# Patient Record
Sex: Female | Born: 1945 | Race: White | Hispanic: No | Marital: Married | Smoking: Never smoker
Health system: Southern US, Community
[De-identification: ages and names within clinical notes are randomized; demographics above are authoritative.]

## PROBLEM LIST (undated history)

## (undated) DIAGNOSIS — D649 Anemia, unspecified: Secondary | ICD-10-CM

## (undated) DIAGNOSIS — K449 Diaphragmatic hernia without obstruction or gangrene: Secondary | ICD-10-CM

## (undated) DIAGNOSIS — F329 Major depressive disorder, single episode, unspecified: Secondary | ICD-10-CM

## (undated) DIAGNOSIS — S4291XA Fracture of right shoulder girdle, part unspecified, initial encounter for closed fracture: Secondary | ICD-10-CM

## (undated) DIAGNOSIS — R011 Cardiac murmur, unspecified: Secondary | ICD-10-CM

## (undated) DIAGNOSIS — M7580 Other shoulder lesions, unspecified shoulder: Secondary | ICD-10-CM

## (undated) DIAGNOSIS — K259 Gastric ulcer, unspecified as acute or chronic, without hemorrhage or perforation: Secondary | ICD-10-CM

## (undated) DIAGNOSIS — S42401A Unspecified fracture of lower end of right humerus, initial encounter for closed fracture: Secondary | ICD-10-CM

## (undated) DIAGNOSIS — K219 Gastro-esophageal reflux disease without esophagitis: Secondary | ICD-10-CM

## (undated) DIAGNOSIS — S62101A Fracture of unspecified carpal bone, right wrist, initial encounter for closed fracture: Secondary | ICD-10-CM

## (undated) DIAGNOSIS — K222 Esophageal obstruction: Secondary | ICD-10-CM

## (undated) HISTORY — DX: Esophageal obstruction: K22.2

## (undated) HISTORY — PX: COLONOSCOPY: SHX174

## (undated) HISTORY — DX: Unspecified fracture of lower end of right humerus, initial encounter for closed fracture: S42.401A

## (undated) HISTORY — DX: Anemia, unspecified: D64.9

## (undated) HISTORY — DX: Gastric ulcer, unspecified as acute or chronic, without hemorrhage or perforation: K25.9

## (undated) HISTORY — DX: Major depressive disorder, single episode, unspecified: F32.9

## (undated) HISTORY — DX: Diaphragmatic hernia without obstruction or gangrene: K44.9

## (undated) HISTORY — PX: ABDOMINAL HYSTERECTOMY: SHX81

## (undated) HISTORY — DX: Fracture of right shoulder girdle, part unspecified, initial encounter for closed fracture: S42.91XA

## (undated) HISTORY — DX: Cardiac murmur, unspecified: R01.1

## (undated) HISTORY — PX: UPPER GASTROINTESTINAL ENDOSCOPY: SHX188

## (undated) HISTORY — DX: Gastro-esophageal reflux disease without esophagitis: K21.9

## (undated) HISTORY — DX: Fracture of unspecified carpal bone, right wrist, initial encounter for closed fracture: S62.101A

## (undated) HISTORY — DX: Other shoulder lesions, unspecified shoulder: M75.80

---

## 1973-10-10 HISTORY — PX: TUBAL LIGATION: SHX77

## 2001-10-10 HISTORY — PX: GASTRIC BYPASS: SHX52

## 2007-10-11 HISTORY — PX: CHOLECYSTECTOMY: SHX55

## 2010-10-20 ENCOUNTER — Ambulatory Visit
Admission: RE | Admit: 2010-10-20 | Discharge: 2010-10-20 | Payer: Self-pay | Source: Home / Self Care | Attending: Family Medicine | Admitting: Family Medicine

## 2010-10-20 ENCOUNTER — Encounter: Payer: Self-pay | Admitting: Family Medicine

## 2010-10-20 DIAGNOSIS — Z9884 Bariatric surgery status: Secondary | ICD-10-CM | POA: Insufficient documentation

## 2010-10-20 DIAGNOSIS — R1110 Vomiting, unspecified: Secondary | ICD-10-CM | POA: Insufficient documentation

## 2010-10-21 ENCOUNTER — Encounter: Payer: Self-pay | Admitting: Family Medicine

## 2010-10-22 ENCOUNTER — Encounter: Payer: Self-pay | Admitting: Family Medicine

## 2010-10-22 LAB — CONVERTED CEMR LAB
ALT: 11 units/L (ref 0–35)
Albumin: 4.4 g/dL (ref 3.5–5.2)
BUN: 11 mg/dL (ref 6–23)
CO2: 24 meq/L (ref 19–32)
Calcium: 9.3 mg/dL (ref 8.4–10.5)
Chloride: 105 meq/L (ref 96–112)
Cholesterol: 174 mg/dL (ref 0–200)
Creatinine, Ser: 0.54 mg/dL (ref 0.40–1.20)
HCT: 38.2 % (ref 36.0–46.0)
HDL: 88 mg/dL (ref >39)
Hemoglobin: 11.8 g/dL — ABNORMAL LOW (ref 12.0–15.0)
Lipase: <10 units/L (ref 0–75)
RBC: 4.02 M/uL (ref 3.87–5.11)
Total CHOL/HDL Ratio: 2
Vit D, 25-Hydroxy: 27 ng/mL — ABNORMAL LOW (ref 30–89)
Vitamin B-12: 197 pg/mL — ABNORMAL LOW (ref 211–911)
WBC: 5.4 10*3/uL (ref 4.0–10.5)

## 2010-10-25 ENCOUNTER — Encounter: Payer: Self-pay | Admitting: Family Medicine

## 2010-10-25 LAB — CONVERTED CEMR LAB: Iron: 144 ug/dL (ref 42–145)

## 2010-10-26 ENCOUNTER — Ambulatory Visit
Admission: RE | Admit: 2010-10-26 | Discharge: 2010-10-26 | Payer: Self-pay | Source: Home / Self Care | Attending: Family Medicine | Admitting: Family Medicine

## 2010-10-26 ENCOUNTER — Encounter
Admission: RE | Admit: 2010-10-26 | Discharge: 2010-10-26 | Payer: Self-pay | Source: Home / Self Care | Attending: Family Medicine | Admitting: Family Medicine

## 2010-10-26 DIAGNOSIS — D510 Vitamin B12 deficiency anemia due to intrinsic factor deficiency: Secondary | ICD-10-CM | POA: Insufficient documentation

## 2010-10-27 ENCOUNTER — Encounter: Payer: Self-pay | Admitting: Family Medicine

## 2010-11-04 ENCOUNTER — Ambulatory Visit
Admission: RE | Admit: 2010-11-04 | Discharge: 2010-11-04 | Payer: Self-pay | Source: Home / Self Care | Attending: Family Medicine | Admitting: Family Medicine

## 2010-11-11 ENCOUNTER — Encounter: Payer: Self-pay | Admitting: Family Medicine

## 2010-11-11 ENCOUNTER — Ambulatory Visit: Payer: Self-pay

## 2010-11-11 DIAGNOSIS — D510 Vitamin B12 deficiency anemia due to intrinsic factor deficiency: Secondary | ICD-10-CM

## 2010-11-11 NOTE — Assessment & Plan Note (Signed)
Summary: B12-acm  Nurse Visit   Past History:  Past Medical History: Esophageal ring and hiatal hernia seen on EGD 10-27-2010 at [facility].    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

## 2010-11-11 NOTE — Assessment & Plan Note (Signed)
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

## 2010-11-11 NOTE — Assessment & Plan Note (Addendum)
Summary: NOV: Vomiting, bariatric status   Vital Signs:  Patient profile:   65 year old female Height:      61 inches Weight:      125 pounds Pulse rate:   86 / minute BP sitting:   138 / 84  (right arm) Cuff size:   regular  Vitals Entered By: Avon Gully CMA, (AAMA) (October 20, 2010 8:16 AM) CC: [facility]   CC:  NP-[facility].  History of Present Illness: Vomiting after eats for the last couple of years.  Has problems with stomach pain, diarrhea and has had tht on and off for year. Had labwork a year ago. Has been seeing Dr. Lissa Morales.  Famly hx of esophageal cancer. She is not on her vitamins post bypass and hasn't for years.  Had her flu shot in Oct.  No change in bowels recently. Dx have a dx of IBS.     3 arm fractures in the last 7 months. Had a bone density about a year ago and was told it was normal.   Eyes have been watering, fuzzing adn uncomfortable for over a month. No change in vision.  Says she realy needs an eye exam.    Habits & Providers  Alcohol-Tobacco-Diet     Alcohol drinks/day: 0     Tobacco Status: never  Exercise-Depression-Behavior     Does Patient Exercise: no     STD Risk: never     Drug Use: no  Past History:  Past Surgical History: Cholecystectomy 209 Gastric bypass 2003 Tubal ligation 1975 Hysterectomy ?  1980s for fibroid  Family History: Father with lung and esophageal Ca Mother with MI/stroke at age 8   Social History: Retired Air traffic controller.  Businesss degree, 1 year.  Married to Girard with 2 daughters and 1 son.  Lives with her husbadn.   2-3 caffeinated drinks per day/  Never Smoked Alcohol use-no Drug use-no Regular exercise-no Smoking Status:  never STD Risk:  never Drug Use:  no Does Patient Exercise:  no  Review of Systems       No fever/sweats/weakness, unexplained weight loss/gain.  + vison changes.  No difficulty hearing/ringing in ears, + hay fever/allergies.  No chest pain/discomfort, palpitations.  No Br  lump/nipple discharge.  No cough/wheeze.  No blood in BM, + nausea/vomiting/diarrhea.  + nighttime urination. No leaking urine, unusual vaginal bleeding, discharge (penis or vagina).  No muscle/joint pain. No rash, change in mole.  + HA, memory loss.  No anxiety, + sleep d/o. No depression.  No easy bruising/bleeding, unexplained lump   Physical Exam  General:  Well-developed,well-nourished,in no acute distress; alert,appropriate and cooperative throughout examination Head:  Normocephalic and atraumatic without obvious abnormalities. No apparent alopecia or balding. Eyes:  No corneal or conjunctival inflammation noted. EOMI. Perrla.  Neck:  Mildly enlarged salivary glands. No TM.  Lungs:  Normal respiratory effort, chest expands symmetrically. Lungs are clear to auscultation, no crackles or wheezes. Heart:  Normal rate and regular rhythm. S1 and S2 normal without gallop, murmur, click, rub or other extra sounds. Abdomen:  Epigastric tenderness.soft, normal bowel sounds, no hepatomegaly, and no splenomegaly.   Skin:  no rashes.   Psych:  Cognition and judgment appear intact. Alert and cooperative with normal attention span and concentration. No apparent delusions, illusions, hallucinations   Impression & Recommendations:  Problem # 1:  VOMITING ALONE (ICD-787.03) Discussed that I am extremley concerned about this. I would like to have her see GI.  Cancer or esoph sgtricture  or complication from her surgery needs to be rule out.  Will check labs and rule out electrolyte abnormalities and anemias from her vomiting and post bypastt.  She does take prevacid daily adn this helps.  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  Will check labs and rule out electrolyte abnormalities and  anemias from her vomiting and post bypastt. I did discuss that she really needs to be on a daily vitamin supplement as bypass dec her ability to absorb vitamins' Rec centrum adult chewable and vitamin D chewa ble. She says she has known hs of low vitamin D.  Orders: T-Magnesium [phone]) T-Vitamin B12 [phone]) T-Vitamin D (25-Hydroxy) [phone])  Other Orders: T-Mammography Bilateral Screening (98921) T-Lipid Profile [phone]) Ophthalmology Referral (Ophthalmology)  Patient Instructions: 1)  Recommend you start a multivitmain. Centrum makes an adult chewable.  2)  Also Nature's Made Chewable vitamin D 400 International Units (take 3-4 a day).  3)  We will call you with the GI referral.    Orders Added: 1)  New Patient Level III [99203] 2)  Gastroenterology Referral [GI] 3)  T-Mammography Bilateral Screening [77057] 4)  T-CBC No Diff [[id number]] 5)  T-Comprehensive Metabolic Panel [[id number]] 6)  T-TSH [48185-63149] 7)  T-Lipid Profile [80061-22930] 8)  T-Lipase [83690-23215] 9)  T-Magnesium [70263-78588] 10)  T-Vitamin B12 [82607-23330] 11)  T-Vitamin D (25-Hydroxy) [50277-41287] 12)  Ophthalmology Referral [Ophthalmology]   Immunization History:  Influenza Immunization History:    Influenza:  historical (07/10/2010)   Immunization History:  Influenza Immunization History:    Influenza:  Historical (07/10/2010)   Immunization History:  Influenza Immunization History:    Influenza:  historical (07/10/2010)  Appended Document: NOV: Vomiting, bariatric status   Preventive Care Screening  Colonoscopy:    Date:  01/01/2008    Next Due:  01/2013    Results:  normal  Bone Density:    Date:  09/08/2009    Next Due:  09/2011    Results:  abnormal  Mammogram:    Date:  09/16/2009    Next Due:  11/2011    Results:  normal  Pap Smear:    Date:  10/23/2009    Next Due:  10/2011    Results:  normal  Last Tetanus Booster:    Date:   10/11/2003    Results:  Historical    Immunization History:  Tetanus/Td Immunization History:    Tetanus/Td:  historical (10/11/2003)  Pneumovax Immunization History:    Pneumovax:  historical (10/10/2005)      Past History:  Past Medical History: Esophageal ring and hiatal hernia seen on EGD 10-27-2010 at [facility].  Hx of Right  shoulder, elbow, wrist fracture.  Hx of heel spurs    Appended Document: NOV: Vomiting, bariatric status   Lipid Panel Test Date: 08/20/2009                        Value        Units        H/L   Reference  Cholesterol:          159          mg/dL              (867-672) LDL Cholesterol:      66  mg/dL              (16-109) HDL Cholesterol:      80           mg/dL              (60-45) Triglyceride:         65           mg/dL              (40-981)  Lab name:             [facility]

## 2010-11-11 NOTE — Consult Note (Signed)
Summary: [facility]  [facility]   Imported By: Lanelle Bal 11/04/2010 10:27:46  _____________________________________________________________________  External Attachment:    Type:   Image     Comment:   External Document

## 2010-11-17 NOTE — Assessment & Plan Note (Signed)
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

## 2010-11-17 NOTE — Consult Note (Signed)
Summary: [facility]  [facility]   Imported By: Lanelle Bal 11/11/2010 14:23:08  _____________________________________________________________________  External Attachment:    Type:   Image     Comment:   External Document

## 2010-11-24 ENCOUNTER — Telehealth: Payer: Self-pay | Admitting: Family Medicine

## 2010-11-24 DIAGNOSIS — E559 Vitamin D deficiency, unspecified: Secondary | ICD-10-CM | POA: Insufficient documentation

## 2010-11-24 DIAGNOSIS — M949 Disorder of cartilage, unspecified: Secondary | ICD-10-CM

## 2010-11-24 DIAGNOSIS — M899 Disorder of bone, unspecified: Secondary | ICD-10-CM | POA: Insufficient documentation

## 2010-11-25 ENCOUNTER — Encounter: Payer: Self-pay | Admitting: Family Medicine

## 2010-11-25 NOTE — Procedures (Signed)
Summary: Upper Endoscopy/[facility]  Upper Endoscopy/[facility]   Imported By: Maryln Gottron 11/17/2010 11:31:52  _____________________________________________________________________  External Attachment:    Type:   Image     Comment:   External Document

## 2010-12-01 NOTE — Progress Notes (Signed)
Summary: dx code  Phone Note Other Incoming Call back at [phone] ext [phone]   Caller: Sande Rives from [facility] labs Summary of Call: needs a dx code for iron level that was done on 10/21/10 Initial call taken by: Avon Gully CMA, Duncan Dull),  November 24, 2010 1:36 PM  Follow-up for Phone Call        285.9 Follow-up by: Nani Gasser MD,  November 25, 2010 9:52 AM  Additional Follow-up for Phone Call Additional follow up Details #1::        left a message on Yvonnes vm Additional Follow-up by: Avon Gully CMA, Duncan Dull),  November 25, 2010 12:53 PM

## 2010-12-06 ENCOUNTER — Encounter: Payer: Self-pay | Admitting: Family Medicine

## 2010-12-16 NOTE — Letter (Signed)
Summary: [facility]  [facility]   Imported By: Lanelle Bal 12/10/2010 11:22:11  _____________________________________________________________________  External Attachment:    Type:   Image     Comment:   External Document

## 2010-12-16 NOTE — Letter (Signed)
Summary: Records Dated 11-08 thru 4-11/Victoria Nnadi MD  Records Dated 11-08 thru 4-11/Victoria Nnadi MD   Imported By: Lanelle Bal 12/09/2010 13:58:41  _____________________________________________________________________  External Attachment:    Type:   Image     Comment:   External Document

## 2010-12-28 NOTE — Consult Note (Signed)
Summary: [facility]-Vomiting  [facility]   Imported By: Maryln Gottron 12/21/2010 10:06:03  _____________________________________________________________________  External Attachment:    Type:   Image     Comment:   External Document

## 2011-03-23 ENCOUNTER — Encounter: Payer: Self-pay | Admitting: Family Medicine

## 2011-03-24 ENCOUNTER — Ambulatory Visit (INDEPENDENT_AMBULATORY_CARE_PROVIDER_SITE_OTHER): Payer: Medicare Other | Admitting: Family Medicine

## 2011-03-24 ENCOUNTER — Encounter: Payer: Self-pay | Admitting: Family Medicine

## 2011-03-24 VITALS — BP 112/74 | HR 71 | Temp 97.5°F | Ht 62.0 in | Wt 131.0 lb

## 2011-03-24 DIAGNOSIS — Z0000 Encounter for general adult medical examination without abnormal findings: Secondary | ICD-10-CM

## 2011-03-24 DIAGNOSIS — H9190 Unspecified hearing loss, unspecified ear: Secondary | ICD-10-CM

## 2011-03-24 MED ORDER — CEFDINIR 300 MG PO CAPS: 300.0000 mg | ORAL_CAPSULE | Freq: Two times a day (BID) | ORAL | Status: AC

## 2011-03-24 NOTE — Progress Notes (Signed)
  Subjective:    Patient ID: Carrie Shannon, female    DOB: 1945-12-19, 65 y.o.   MRN: [id number]  HPI    Review of Systems     Objective:   Physical Exam        Assessment & Plan:

## 2011-03-24 NOTE — Progress Notes (Signed)
  Subjective:    Patient ID: Carrie Shannon, female    DOB: 01-14-1946, 65 y.o.   MRN: [id number]  HPI Went to [facility] and given rx for amoxicillin for her left Ear.  Now having occ pain in her right ear as well.  No can't hear out of it. Used a Qtip this am and got some wax out. No ear drainage. No fever. Occpainful but intermittant and brief. No HA or ST now, but did initially. Says she has had to doublee the volume on her TV since this ear infection. She did complete her ABX.     Review of Systems     Objective:   Physical Exam  Constitutional: She appears well-developed and well-nourished.  HENT:  Head: Normocephalic and atraumatic.  Right Ear: External ear normal.  Left Ear: External ear normal.  Nose: Nose normal.  Mouth/Throat: Oropharynx is clear and moist.       TMS and canals are clear bilaterally. Almost no was in the left ear.   Eyes: Conjunctivae are normal. Pupils are equal, round, and reactive to light.  Neck: Neck supple. No thyromegaly present.  Lymphadenopathy:    She has no cervical adenopathy.          Assessment & Plan:  Acute hearing loss left ear-

## 2011-03-24 NOTE — Progress Notes (Signed)
Addended by: Nani Gasser D on: 03/24/2011 10:38 AM   Modules accepted: Orders, Level of Service

## 2011-03-24 NOTE — Patient Instructions (Addendum)
Call if not better in 10 days and we will refer you the ENT Can use the nasal spray 2 sprays in each nostril once a day.

## 2011-03-24 NOTE — Progress Notes (Signed)
  Subjective:    Patient ID: Carrie Shannon, female    DOB: 03-23-1946, 65 y.o.   MRN: [id number]  HPI    Review of Systems     Objective:   Physical Exam        Assessment & Plan:   Will retreat with second round of antibiotics and will add an nasal antihistamine.  Call if not better in 10 days.

## 2011-03-31 ENCOUNTER — Telehealth: Payer: Self-pay | Admitting: Family Medicine

## 2011-03-31 NOTE — Telephone Encounter (Signed)
Pt called and currently has an lear infection and is being treated from our office, and now has developed dizziness and vertigo.  Requesting prescription for these symptoms.  Plan:  Routed to Dr. Linford Arnold for requests. Jarvis Newcomer, LPN Domingo Dimes

## 2011-03-31 NOTE — Telephone Encounter (Signed)
She can use meclizine which is now over-the-counter. She no longer needs a prescription. It is actually the same drug in non-drowsy Dramamine. She can ask the pharmacist to help her find it. If she still having vertigo and dizziness after the weekend please let us know or if she feels her ear infection if not improving.

## 2011-04-01 ENCOUNTER — Telehealth: Payer: Self-pay | Admitting: Family Medicine

## 2011-04-01 NOTE — Telephone Encounter (Signed)
Pt informed by [facility] that she can get otc meclizine in non-drowsy formula.  Told to call if no better by next week Jarvis Newcomer, LPN Domingo Dimes

## 2011-04-01 NOTE — Telephone Encounter (Signed)
Pt informed that she needs to call [facility] to get her colonoscopy scheduled because they have been trying to reach her.  Gave the pt the specialist office #.  Had to leave details on machine, Carrie Shannon, Carrie Shannon

## 2011-04-01 NOTE — Telephone Encounter (Signed)
Call patient: Please have her call [facility] to schedule her [facility]. Evidently they have been trying to contact her and have been unable to get a hold of her. Thus they sent [place] a letter so that we could help expedite getting her scheduled. If she needs a referral will be happy to put one in, if not then we can just give her the phone number which is [phone] to contact [facility] and get scheduled for a colonoscopy.

## 2011-05-03 ENCOUNTER — Telehealth: Payer: Self-pay | Admitting: Family Medicine

## 2011-05-03 MED ORDER — FLUCONAZOLE 150 MG PO TABS: 150.0000 mg | ORAL_TABLET | Freq: Once | ORAL | Status: AC

## 2011-05-03 NOTE — Telephone Encounter (Signed)
Advised pt of med called in and to call if not better.

## 2011-05-03 NOTE — Telephone Encounter (Signed)
Pt calls and states she took abx 6-8 wk ago and x 2-3 has vag itching, no discharge and a rash-has been using Neosporin in her vag area! Pharm [facility]. Says it feels like a yeast inf.

## 2011-05-03 NOTE — Telephone Encounter (Signed)
Will call in diflucan but if rash not better by end of week make appt.

## 2011-10-28 ENCOUNTER — Encounter: Payer: Medicare Other | Admitting: Family Medicine

## 2011-11-02 ENCOUNTER — Telehealth: Payer: Self-pay | Admitting: Family Medicine

## 2011-11-02 ENCOUNTER — Other Ambulatory Visit: Payer: Self-pay | Admitting: Family Medicine

## 2011-11-02 DIAGNOSIS — E559 Vitamin D deficiency, unspecified: Secondary | ICD-10-CM | POA: Diagnosis not present

## 2011-11-02 DIAGNOSIS — D510 Vitamin B12 deficiency anemia due to intrinsic factor deficiency: Secondary | ICD-10-CM

## 2011-11-02 DIAGNOSIS — Z0000 Encounter for general adult medical examination without abnormal findings: Secondary | ICD-10-CM

## 2011-11-02 DIAGNOSIS — Z9884 Bariatric surgery status: Secondary | ICD-10-CM

## 2011-11-02 DIAGNOSIS — Z1231 Encounter for screening mammogram for malignant neoplasm of breast: Secondary | ICD-10-CM

## 2011-11-02 DIAGNOSIS — K3189 Other diseases of stomach and duodenum: Secondary | ICD-10-CM | POA: Diagnosis not present

## 2011-11-02 DIAGNOSIS — M899 Disorder of bone, unspecified: Secondary | ICD-10-CM

## 2011-11-02 DIAGNOSIS — Z13220 Encounter for screening for lipoid disorders: Secondary | ICD-10-CM

## 2011-11-02 NOTE — Telephone Encounter (Signed)
I printed slip. I used her dx of bariatric status, osteopenia, etc.

## 2011-11-02 NOTE — Telephone Encounter (Signed)
Patient called to reschedule cpe appt due to the snow last Friday and request to get a lab order sent downstairs by Friday Jan 25th to get blood work done a head of time before her appt.

## 2011-11-02 NOTE — Telephone Encounter (Signed)
Put in the order but it the dx of routine physical exam was rejected by medicare.what dx code should be used?

## 2011-11-04 LAB — COMPLETE METABOLIC PANEL WITH GFR
ALT: 10 U/L (ref 0–35)
AST: 17 U/L (ref 0–37)
Albumin: 4.3 g/dL (ref 3.5–5.2)
Alkaline Phosphatase: 85 U/L (ref 39–117)
BUN: 12 mg/dL (ref 6–23)
Calcium: 8.9 mg/dL (ref 8.4–10.5)
Chloride: 106 mEq/L (ref 96–112)
Potassium: 4.6 mEq/L (ref 3.5–5.3)
Sodium: 141 mEq/L (ref 135–145)
Total Protein: 6.4 g/dL (ref 6.0–8.3)

## 2011-11-04 LAB — LIPID PANEL
Cholesterol: 167 mg/dL (ref 0–200)
VLDL: 16 mg/dL (ref 0–40)

## 2011-11-05 LAB — CBC
HCT: 39.5 % (ref 36.0–46.0)
MCH: 29.7 pg (ref 26.0–34.0)
MCHC: 30.4 g/dL (ref 30.0–36.0)
MCV: 97.8 fL (ref 78.0–100.0)
Platelets: 339 10*3/uL (ref 150–400)
RDW: 13.2 % (ref 11.5–15.5)
WBC: 5.1 10*3/uL (ref 4.0–10.5)

## 2011-11-08 ENCOUNTER — Encounter: Payer: Self-pay | Admitting: Family Medicine

## 2011-11-08 ENCOUNTER — Ambulatory Visit (INDEPENDENT_AMBULATORY_CARE_PROVIDER_SITE_OTHER): Payer: Medicare Other | Admitting: Family Medicine

## 2011-11-08 VITALS — BP 111/73 | HR 74 | Temp 98.6°F | Wt 135.0 lb

## 2011-11-08 DIAGNOSIS — K3189 Other diseases of stomach and duodenum: Secondary | ICD-10-CM

## 2011-11-08 DIAGNOSIS — Z9884 Bariatric surgery status: Secondary | ICD-10-CM

## 2011-11-08 DIAGNOSIS — Z23 Encounter for immunization: Secondary | ICD-10-CM

## 2011-11-08 MED ORDER — LANSOPRAZOLE 30 MG PO CPDR: 30.0000 mg | DELAYED_RELEASE_CAPSULE | Freq: Every day | ORAL | Status: DC

## 2011-11-08 MED ORDER — AMBULATORY NON FORMULARY MEDICATION: Status: DC

## 2011-11-08 NOTE — Patient Instructions (Signed)
Go ahead and schedule a follow up with Dr. Dellie Catholic.

## 2011-11-08 NOTE — Progress Notes (Signed)
  Subjective:    Patient ID: Carrie Shannon, female    DOB: 04-17-1946, 66 y.o.   MRN: [id number]  HPI Originally we had her on the schedule for a Medicare annual wellness exam but she says that she is actually here to followup on her vomiting. Vomiting after eating for 3 years, with every meal. Can happen within minutes of eating. No significant abdominal pain, chest pain or shortness of breath. She reports she has seen GI for an this in the past. She says it's been about a year ago. She said they did some tests but cannot precisely what they told her to do. She's had no recent change in bowels or stools. She does have a history of bariatric surgery. It sounds like she may have had some stomach stapling but she's not 100% sure. She feels that the vomiting as occurring more frequently it happens everyday after each meal now. She says she has been sucking on candy all day so that she can keep something on her stomach and so she feels she is now gaining weight because she is constantly eating sugar. She did have an old prescription for doxepin and her purse. But she says it gave her severe headaches and so she stopped it after trying it for several weeks. She's now been using Tylenol PM at night to help her sleep.    Review of Systems     Objective:   Physical Exam  Constitutional: She is oriented to person, place, and time. She appears well-developed and well-nourished.  HENT:  Head: Normocephalic and atraumatic.  Right Ear: External ear normal.  Left Ear: External ear normal.  Nose: Nose normal.  Mouth/Throat: Oropharynx is clear and moist.       TMs and canals are clear.   Eyes: Conjunctivae and EOM are normal. Pupils are equal, round, and reactive to light.  Neck: Neck supple. No thyromegaly present.  Cardiovascular: Normal rate, regular rhythm and normal heart sounds.   Pulmonary/Chest: Effort normal and breath sounds normal. She has no wheezes.  Abdominal: Soft. Bowel sounds are normal. She  exhibits no distension and no mass. There is no tenderness. There is no rebound and no guarding.  Lymphadenopathy:    She has no cervical adenopathy.  Neurological: She is alert and oriented to person, place, and time.  Skin: Skin is warm and dry.  Psychiatric: She has a normal mood and affect.          Assessment & Plan:  funcitonal vomiting - I looked through the old skin records from her gastroenterology office. Evidently they had wanted her to try the doxepin after her small bowel follow-through was normal. She did this but they cause headaches. I did see an additional medevac from June were they were talking about possibly rechecking her for stricture at the GE junction. I strongly encouraged her to make a followup appointment with them so that they can decide if they want to do another endoscopy or not. Please consider an alternative medication to the doxepin since it caused headaches. I certainly think a stricture is possible because her functional vomiting has gotten worse over last several months.  GERD- Refilled her Prevacid.   Pneumonia vaccine given today.   Discussed the need for shingles vaccine. Patient agreed. Rx given for shingles vacccine to take to the pharmacy.

## 2011-11-22 ENCOUNTER — Ambulatory Visit
Admission: RE | Admit: 2011-11-22 | Discharge: 2011-11-22 | Disposition: A | Discharge status: Home / Self Care | Payer: Medicare Other | Source: Ambulatory Visit | Attending: Family Medicine | Admitting: Family Medicine

## 2011-11-22 DIAGNOSIS — Z1231 Encounter for screening mammogram for malignant neoplasm of breast: Secondary | ICD-10-CM

## 2011-12-23 DIAGNOSIS — R1110 Vomiting, unspecified: Secondary | ICD-10-CM | POA: Diagnosis not present

## 2012-01-13 ENCOUNTER — Encounter: Payer: Self-pay | Admitting: Family Medicine

## 2012-01-13 DIAGNOSIS — K589 Irritable bowel syndrome without diarrhea: Secondary | ICD-10-CM | POA: Diagnosis not present

## 2012-01-13 DIAGNOSIS — F9821 Rumination disorder of infancy: Secondary | ICD-10-CM | POA: Insufficient documentation

## 2012-01-18 ENCOUNTER — Encounter: Payer: Self-pay | Admitting: Family Medicine

## 2013-01-14 ENCOUNTER — Other Ambulatory Visit: Payer: Self-pay | Admitting: Family Medicine

## 2013-01-14 DIAGNOSIS — Z1231 Encounter for screening mammogram for malignant neoplasm of breast: Secondary | ICD-10-CM

## 2013-01-22 ENCOUNTER — Ambulatory Visit (INDEPENDENT_AMBULATORY_CARE_PROVIDER_SITE_OTHER): Payer: Medicare Other

## 2013-01-22 DIAGNOSIS — Z1231 Encounter for screening mammogram for malignant neoplasm of breast: Secondary | ICD-10-CM

## 2013-04-01 ENCOUNTER — Encounter: Payer: Self-pay | Admitting: Family Medicine

## 2013-04-01 ENCOUNTER — Ambulatory Visit (INDEPENDENT_AMBULATORY_CARE_PROVIDER_SITE_OTHER): Payer: Medicare Other | Admitting: Family Medicine

## 2013-04-01 VITALS — BP 119/79 | HR 83 | Temp 98.1°F | Resp 16 | Wt 148.0 lb

## 2013-04-01 DIAGNOSIS — M25569 Pain in unspecified knee: Secondary | ICD-10-CM

## 2013-04-01 DIAGNOSIS — M5432 Sciatica, left side: Secondary | ICD-10-CM

## 2013-04-01 DIAGNOSIS — M25562 Pain in left knee: Secondary | ICD-10-CM

## 2013-04-01 DIAGNOSIS — M5430 Sciatica, unspecified side: Secondary | ICD-10-CM

## 2013-04-01 MED ORDER — MELOXICAM 15 MG PO TABS: 15.0000 mg | ORAL_TABLET | Freq: Every day | ORAL | Status: DC

## 2013-04-01 NOTE — Progress Notes (Signed)
CC: Carrie Shannon is a 67 y.o. female is here for Leg Pain   Subjective: HPI:  Patient complains of bilateral knee pain and leg pain that has been present for 3 months. Over the past month she has been most concerned with left knee pain. It is worse with walking, hip flexion, sitting for long periods of time or going up or downstairs. Is described as a discomfort moderate in severity that is beneath the kneecap radiates into the back of the knee. She reports swelling in the popliteal space but nothing anteriorly. No interventions as of yet. Pain improves with rest. Present al lhours of the day, worse in the evening.  She describes leg pain originally in the right leg down the left leg that has been present for 3 months. It is described as a focal discomfort mild to moderate in severity in one of the bones near her buttocks that radiates down the back of the leg. It occurs after sitting for long periods of time, lying on her left side. It is improved with movement of the left leg. There are days where the pain is completely absent, without warning or reason she'll go with weeks of daily pain. No interventions as of yet.   She denies joint swelling, redness, warmth. She denies locking catching giving way of either knee nor motor or sensory disturbances of her appendages. She denies skin changes in the lower extremities. She denies fevers, chills, back pain, pelvic pain, abdominal pain.    Review Of Systems Outlined In HPI  Past Medical History  Diagnosis Date  . Hiatal hernia     EGD 10-27-10/[facility]  . Shoulder fracture, right   . Elbow fracture, right   . Wrist fracture, right   . AC (acromioclavicular) joint bone spurs     heels  . Esophageal ring      Family History  Problem Relation Age of Onset  . Cancer Father     esophageal and lung cancer  . Heart attack Mother   . Stroke Mother 54     History  Substance Use Topics  . Smoking status: Never Smoker    . Smokeless tobacco: Not on file  . Alcohol Use: No     Objective: Filed Vitals:   04/01/13 0937  BP: 119/79  Pulse: 83  Temp: 98.1 F (36.7 C)  Resp: 16    General: Alert and Oriented, No Acute Distress HEENT: Pupils equal, round, reactive to light. Conjunctivae clear.  Moist mucous membranes Lungs: Clear comfortable work of breathing Cardiac: Regular rate and rhythm.  Extremities: No peripheral edema.  Strong peripheral pulses. Straight leg raise on the left is positive, log roll negative. Left knee exam: Negative patellar apprehension, no pain on patellar pole or superior aspect, mild crepitus with knee extension/flexion, she has full range of motion strength in the knee, no appreciable swelling anteriorly, mild pain reproduction with palpation the popliteal space without evidence of pulsatile mass, no pain or laxity with valgus or varus loading, negative anterior drawer. Mental Status: No depression, anxiety, nor agitation. Skin: Warm and dry.  Assessment & Plan: Carrie Shannon was seen today for leg pain.  Diagnoses and associated orders for this visit:  Left knee pain - meloxicam (MOBIC) 15 MG tablet; Take 1 tablet (15 mg total) by mouth daily.  Sciatic pain, left    Discussed with patient I believe her left knee pain due to osteoarthritis with a component of patellofemoral syndrome. Discussed that her sciatica may have  set her up for poor biomechanics which is exacerbating her osteoarthritis symptoms. I like her to focus on physical therapy exercises at home for the next 2 weeks focused on piriformis syndrome I laterally, Mobic on a daily basis for pain, if not improved in 2 weeks will consider left knee steroid injection.  Return in about 2 weeks (around 04/15/2013).

## 2013-04-16 ENCOUNTER — Ambulatory Visit (INDEPENDENT_AMBULATORY_CARE_PROVIDER_SITE_OTHER): Payer: Medicare Other | Admitting: Family Medicine

## 2013-04-16 ENCOUNTER — Encounter: Payer: Self-pay | Admitting: Family Medicine

## 2013-04-16 VITALS — BP 109/73 | HR 97 | Wt 151.0 lb

## 2013-04-16 DIAGNOSIS — M25569 Pain in unspecified knee: Secondary | ICD-10-CM

## 2013-04-16 DIAGNOSIS — M25562 Pain in left knee: Secondary | ICD-10-CM

## 2013-04-16 NOTE — Progress Notes (Signed)
CC: Carrie Shannon is a 67 y.o. female is here for f/u knee pain   Subjective: HPI:  Patient returns for followup of left knee pain: She reports no improvement of pain since I saw her 2weeks ago. She has been trying to do home physical therapy however it seems to exacerbate the pain somewhat she admits she has not been able to doing it on a daily basis. She has been taking meloxicam on a daily basis without any improvement of the pain. She feels the pain is no better or worse since I saw her last. Still localized to the left knee slightly behind the kneecap radiates to the back of the knee. Worse with climbing or descending stairs, worse with walking, worse after periods of inactivity. Slightly improved the longer she is mobile. She denies redness, swelling, warmth nor skin changes in the left knee. Denies fevers, chills, nor recent or remote trauma   Review Of Systems Outlined In HPI  Past Medical History  Diagnosis Date  . Hiatal hernia     EGD 10-27-10/[facility]  . Shoulder fracture, right   . Elbow fracture, right   . Wrist fracture, right   . AC (acromioclavicular) joint bone spurs     heels  . Esophageal ring      Family History  Problem Relation Age of Onset  . Cancer Father     esophageal and lung cancer  . Heart attack Mother   . Stroke Mother 46     History  Substance Use Topics  . Smoking status: Never Smoker   . Smokeless tobacco: Not on file  . Alcohol Use: No     Objective: Filed Vitals:   04/16/13 0847  BP: 109/73  Pulse: 97    General: Alert and Oriented, No Acute Distress Lungs: Clear to auscultation bilaterally, no wheezing/ronchi/rales.  Comfortable work of breathing. Good air movement. Cardiac: Regular rate and rhythm. Normal S1/S2.  No murmurs, rubs, nor gallops.   Extremities: No peripheral edema. Strong peripheral pulses. Straight leg raise on the left is positive, log roll negative. Left knee exam: Negative patellar  apprehension, no pain on patellar pole or superior aspect, mild crepitus with knee extension/flexion, she has full range of motion strength in the knee, no appreciable swelling anteriorly, mild pain reproduction with palpation the popliteal space without evidence of pulsatile mass, no pain or laxity with valgus or varus loading, negative anterior drawer. Mental Status: No depression, anxiety, nor agitation. Skin: Warm and dry.  Assessment & Plan: Carrie Shannon was seen today for f/u knee pain.  Diagnoses and associated orders for this visit:  Left knee pain    Left knee pain: Uncontrolled, again communicated my suspicion of osteoarthritis and a component of patellofemoral syndrome not improved with home physical therapy. She is open to the idea of a steroid injection today and agrees to continue with home physical therapy if pain is improved after injection today.  Return if symptoms worsen or fail to improve.  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 The joint was prepped with Betadine and injected from a inferior lateral approach with 21G needle 3 ml 1% lidocaine and 2 ml of triamcinolone (KENALOG) 40mg /ml was then injected into the joint through the same needle. The needle was removed and the area cleansed and dressed.  Complications:  None; patient tolerated the procedure well.

## 2013-04-18 ENCOUNTER — Telehealth: Payer: Self-pay | Admitting: *Deleted

## 2013-04-18 DIAGNOSIS — G8929 Other chronic pain: Secondary | ICD-10-CM

## 2013-04-18 NOTE — Telephone Encounter (Signed)
Pt called and states she still having knee pain and the injection she received in the office has not really helped.Wants to know what to do now.Please advise

## 2013-04-18 NOTE — Telephone Encounter (Signed)
Sue Lush, Will you please let Mrs. Carlile know that I think she warrants a specialists opinion if the injection didn't help. I'd encourge her to see Dr. Karie Schwalbe as a sports medicine referral.  Having xrays before this visit would make the visit much more informative so i've placed an order for Left Knee Xrays to be obtained downstairs just before this visit.  Will you please connect her up front to schedule this referral, I'm sorry she's not feeling better by now.

## 2013-04-18 NOTE — Telephone Encounter (Signed)
Pt notified and transferred up front to schedule an appt with Dr. Karie Schwalbe. Pt aware to get an xray before the appt with Dr. Karie Schwalbe

## 2013-04-19 ENCOUNTER — Other Ambulatory Visit: Payer: Self-pay | Admitting: Family Medicine

## 2013-04-19 ENCOUNTER — Ambulatory Visit (INDEPENDENT_AMBULATORY_CARE_PROVIDER_SITE_OTHER): Payer: Medicare Other

## 2013-04-19 DIAGNOSIS — G8929 Other chronic pain: Secondary | ICD-10-CM

## 2013-04-19 DIAGNOSIS — M25469 Effusion, unspecified knee: Secondary | ICD-10-CM

## 2013-04-19 DIAGNOSIS — M25569 Pain in unspecified knee: Secondary | ICD-10-CM

## 2013-04-26 ENCOUNTER — Ambulatory Visit (INDEPENDENT_AMBULATORY_CARE_PROVIDER_SITE_OTHER): Payer: Medicare Other | Admitting: Sports Medicine

## 2013-04-26 ENCOUNTER — Ambulatory Visit (INDEPENDENT_AMBULATORY_CARE_PROVIDER_SITE_OTHER): Payer: Medicare Other

## 2013-04-26 ENCOUNTER — Encounter: Payer: Self-pay | Admitting: Sports Medicine

## 2013-04-26 VITALS — BP 120/77 | HR 76 | Wt 148.0 lb

## 2013-04-26 DIAGNOSIS — M1710 Unilateral primary osteoarthritis, unspecified knee: Secondary | ICD-10-CM

## 2013-04-26 DIAGNOSIS — M1712 Unilateral primary osteoarthritis, left knee: Secondary | ICD-10-CM | POA: Insufficient documentation

## 2013-04-26 DIAGNOSIS — IMO0002 Reserved for concepts with insufficient information to code with codable children: Secondary | ICD-10-CM | POA: Diagnosis not present

## 2013-04-26 DIAGNOSIS — M5416 Radiculopathy, lumbar region: Secondary | ICD-10-CM

## 2013-04-26 MED ORDER — PREDNISONE 50 MG PO TABS: ORAL_TABLET | ORAL | Status: DC

## 2013-04-26 NOTE — Progress Notes (Signed)
  Subjective:    CC: Referral from Dr. Ivan Anchors  HPI: Left knee pain: Larita Fife has a known history of knee osteoarthritis confirmed with x-ray, she appropriately received an intra-articular injection by Dr. Ivan Anchors unfortunately did not note improvement, not even temporarily after the injection, several days after the injection she did start to feel some relief. She localizes her pain running down the posterior aspect of her left knee, over the anterior and posterior aspect of her left knee, and down into her posterior lower leg but not to the toes. Pain is moderate, persistent. She denies any pain with riding a car, Valsalva, flexion, no pain with extension. No bowel or bladder dysfunction, no saddle anesthesia, no constitutional symptoms.  Past medical history, Surgical history, Family history not pertinant except as noted below, Social history, Allergies, and medications have been entered into the medical record, reviewed, and no changes needed.   Review of Systems: No fevers, chills, night sweats, weight loss, chest pain, or shortness of breath.   Objective:    General: Well Developed, well nourished, and in no acute distress.  Neuro: Alert and oriented x3, extra-ocular muscles intact, sensation grossly intact.  HEENT: Normocephalic, atraumatic, pupils equal round reactive to light, neck supple, no masses, no lymphadenopathy, thyroid nonpalpable.  Skin: Warm and dry, no rashes. Cardiac: Regular rate and rhythm, no murmurs rubs or gallops, no lower extremity edema.  Respiratory: Clear to auscultation bilaterally. Not using accessory muscles, speaking in full sentences. Left Knee: Normal to inspection with no erythema or effusion or obvious bony abnormalities. Tender to palpation at the medial joint line. ROM full in flexion and extension and lower leg rotation. Ligaments with solid consistent endpoints including ACL, PCL, LCL, MCL. Negative Mcmurray's, Apley's, and Thessalonian tests. Non  painful patellar compression. Patellar glide without crepitus. Patellar and quadriceps tendons unremarkable. Hamstring and quadriceps strength is normal.  Back Exam:  Inspection: Unremarkable  Motion: Flexion 45 deg, Extension 45 deg, Side Bending to 45 deg bilaterally,  Rotation to 45 deg bilaterally  SLR laying: Positive with reproduction of radicular symptoms down the leg.  XSLR laying: Negative  Palpable tenderness: None. FABER: negative. Sensory change: Gross sensation intact to all lumbar and sacral dermatomes.  Reflexes: 2+ at both patellar tendons, 2+ at achilles tendons, Babinski's downgoing.  Strength at foot  Plantar-flexion: 5/5 Dorsi-flexion: 5/5 Eversion: 5/5 Inversion: 5/5  Leg strength  Quad: 5/5 Hamstring: 5/5 Hip flexor: 5/5 Hip abductors: 5/5  Gait unremarkable.  X-rays of the knee were reviewed, they do show mild to moderate medial compartmental DJD.  Lumbar spine x-rays reviewed and show degenerative disc disease worse at the L5-S1 level.  Impression and Recommendations:

## 2013-04-26 NOTE — Assessment & Plan Note (Addendum)
Think that this is her principal pain generator, L5 vs S1 nerve root. For improvement several days after the injection was likely related to systemic steroid effect. Prednisone, formal physical therapy, x-rays. Return in 4 weeks, MRI for interventional injection planning if no better.

## 2013-04-26 NOTE — Assessment & Plan Note (Signed)
Without immediate pain relief after injection, does make me suspect the knees not the principal pain generator. Formal physical therapy for the knee, she'll return in 4 weeks, and we can try repeat guided injection if no better.

## 2013-05-01 ENCOUNTER — Telehealth: Payer: Self-pay | Admitting: *Deleted

## 2013-05-01 NOTE — Telephone Encounter (Signed)
Please print out my referral and send to [facility] office in [place] and let patient know. Thanks!

## 2013-05-01 NOTE — Telephone Encounter (Signed)
Pt called stated that was referred to Physical Therapy here but they do not accept her medicare. She wants to know if you can refer her to someone who accepts her medicare. Carrie Shannon,CMA

## 2013-05-01 NOTE — Telephone Encounter (Signed)
Returning pt's call she did not leave any details.Carrie Shannon, [name]

## 2013-05-02 NOTE — Telephone Encounter (Signed)
Referral faxed to [facility] [phone]. Maor Meckel,CMA

## 2013-05-16 DIAGNOSIS — K589 Irritable bowel syndrome without diarrhea: Secondary | ICD-10-CM | POA: Diagnosis not present

## 2013-06-04 ENCOUNTER — Ambulatory Visit (INDEPENDENT_AMBULATORY_CARE_PROVIDER_SITE_OTHER): Payer: Medicare Other | Admitting: Sports Medicine

## 2013-06-04 VITALS — BP 122/87 | HR 100 | Wt 148.0 lb

## 2013-06-04 DIAGNOSIS — M5416 Radiculopathy, lumbar region: Secondary | ICD-10-CM

## 2013-06-04 DIAGNOSIS — M1712 Unilateral primary osteoarthritis, left knee: Secondary | ICD-10-CM

## 2013-06-04 DIAGNOSIS — IMO0002 Reserved for concepts with insufficient information to code with codable children: Secondary | ICD-10-CM

## 2013-06-04 DIAGNOSIS — M1710 Unilateral primary osteoarthritis, unspecified knee: Secondary | ICD-10-CM

## 2013-06-04 NOTE — Assessment & Plan Note (Signed)
She does have some knee osteoarthritis, principal pain generator is coming from the lumbar spine. Pain has resolved with formal physical therapy for the lumbar spine, as well as initial oral medications. Return as needed.

## 2013-06-04 NOTE — Assessment & Plan Note (Signed)
This is doing well after injection by Dr. Ivan Anchors.

## 2013-06-04 NOTE — Progress Notes (Signed)
  Subjective:    CC: Followup  HPI: Left lumbar radiculitis: This pleasant female to have some left knee pain in the posterior aspect, she had a knee injection which did not resolve her symptoms on the day of injection, she did note some improvement over the next several days.  I put her through physical therapy, oral steroids, and she returns pain free.  Past medical history, Surgical history, Family history not pertinant except as noted below, Social history, Allergies, and medications have been entered into the medical record, reviewed, and no changes needed.   Review of Systems: No fevers, chills, night sweats, weight loss, chest pain, or shortness of breath.   Objective:    General: Well Developed, well nourished, and in no acute distress.  Neuro: Alert and oriented x3, extra-ocular muscles intact, sensation grossly intact.  HEENT: Normocephalic, atraumatic, pupils equal round reactive to light, neck supple, no masses, no lymphadenopathy, thyroid nonpalpable.  Skin: Warm and dry, no rashes. Cardiac: Regular rate and rhythm, no murmurs rubs or gallops, no lower extremity edema.  Respiratory: Clear to auscultation bilaterally. Not using accessory muscles, speaking in full sentences.  Impression and Recommendations:

## 2013-07-26 ENCOUNTER — Ambulatory Visit: Payer: Medicare Other | Admitting: Sports Medicine

## 2013-07-31 ENCOUNTER — Encounter: Payer: Self-pay | Admitting: Sports Medicine

## 2013-07-31 ENCOUNTER — Ambulatory Visit (INDEPENDENT_AMBULATORY_CARE_PROVIDER_SITE_OTHER): Payer: Medicare Other | Admitting: Sports Medicine

## 2013-07-31 ENCOUNTER — Telehealth: Payer: Self-pay

## 2013-07-31 VITALS — BP 132/84 | HR 61 | Wt 147.0 lb

## 2013-07-31 DIAGNOSIS — IMO0002 Reserved for concepts with insufficient information to code with codable children: Secondary | ICD-10-CM | POA: Diagnosis not present

## 2013-07-31 DIAGNOSIS — Z9884 Bariatric surgery status: Secondary | ICD-10-CM

## 2013-07-31 DIAGNOSIS — M5416 Radiculopathy, lumbar region: Secondary | ICD-10-CM

## 2013-07-31 DIAGNOSIS — Z13220 Encounter for screening for lipoid disorders: Secondary | ICD-10-CM

## 2013-07-31 DIAGNOSIS — M1710 Unilateral primary osteoarthritis, unspecified knee: Secondary | ICD-10-CM

## 2013-07-31 DIAGNOSIS — E538 Deficiency of other specified B group vitamins: Secondary | ICD-10-CM

## 2013-07-31 DIAGNOSIS — E559 Vitamin D deficiency, unspecified: Secondary | ICD-10-CM

## 2013-07-31 DIAGNOSIS — M1712 Unilateral primary osteoarthritis, left knee: Secondary | ICD-10-CM

## 2013-07-31 MED ORDER — GABAPENTIN 300 MG PO CAPS: ORAL_CAPSULE | ORAL | Status: DC

## 2013-07-31 NOTE — Assessment & Plan Note (Signed)
Doing well 3-4 months after injection. We can revisit this at a later date if needed.

## 2013-07-31 NOTE — Progress Notes (Signed)
  Subjective:    CC: Followup  HPI: Left knee osteoarthritis: Continues to do well after injection several months ago.  Lumbar radiculitis: Improved slightly with physical therapy, unfortunately she is now starting to have pain again, referable to the left L5 nerve root. She does want to do conservative measures, pain is worse with sitting, Valsalva, riding in a car.  Past medical history, Surgical history, Family history not pertinant except as noted below, Social history, Allergies, and medications have been entered into the medical record, reviewed, and no changes needed.   Review of Systems: No fevers, chills, night sweats, weight loss, chest pain, or shortness of breath.   Objective:    General: Well Developed, well nourished, and in no acute distress.  Neuro: Alert and oriented x3, extra-ocular muscles intact, sensation grossly intact.  HEENT: Normocephalic, atraumatic, pupils equal round reactive to light, neck supple, no masses, no lymphadenopathy, thyroid nonpalpable.  Skin: Warm and dry, no rashes. Cardiac: Regular rate and rhythm, no murmurs rubs or gallops, no lower extremity edema.  Respiratory: Clear to auscultation bilaterally. Not using accessory muscles, speaking in full sentences. Left Knee: Normal to inspection with no erythema or effusion or obvious bony abnormalities. Tender to palpation at the medial and lateral joint lines. I did review herROM full in flexion and extension and lower leg rotation. Ligaments with solid consistent endpoints including ACL, PCL, LCL, MCL. Negative Mcmurray's, Apley's, and Thessalonian tests. Non painful patellar compression. Patellar glide without crepitus. Patellar and quadriceps tendons unremarkable. Hamstring and quadriceps strength is normal.   I did review her x-rays again, there are multilevel degenerative changes, worse at the L5-S1 with grade 1 spondylolisthesis.  Impression and Recommendations:

## 2013-07-31 NOTE — Telephone Encounter (Signed)
Labs entered, she can go fasting anytime

## 2013-07-31 NOTE — Assessment & Plan Note (Signed)
Symptoms are referable to the right L5 nerve root. We will continue physical therapy here, aggressive back extensor and flexor strengthening. Starting gabapentin up taper. Return in 4 weeks, MRI for interventional injection planning if no better.

## 2013-07-31 NOTE — Telephone Encounter (Signed)
Pt informed.Carrie Shannon  

## 2013-07-31 NOTE — Telephone Encounter (Signed)
Patient stated that she has an appt for a CPE on 08/22/2013 she wants to know if she can get an order to have her labs done before her appt. Carrie Shannon,CMA

## 2013-08-12 ENCOUNTER — Ambulatory Visit: Payer: Medicare Other | Admitting: Physical Therapy

## 2013-08-13 DIAGNOSIS — E538 Deficiency of other specified B group vitamins: Secondary | ICD-10-CM | POA: Diagnosis not present

## 2013-08-13 DIAGNOSIS — E559 Vitamin D deficiency, unspecified: Secondary | ICD-10-CM | POA: Diagnosis not present

## 2013-08-13 LAB — COMPLETE METABOLIC PANEL WITH GFR
Alkaline Phosphatase: 87 U/L (ref 39–117)
BUN: 14 mg/dL (ref 6–23)
CO2: 26 mEq/L (ref 19–32)
Creat: 0.56 mg/dL (ref 0.50–1.10)
GFR, Est African American: >89 mL/min
GFR, Est Non African American: >89 mL/min
Glucose, Bld: 83 mg/dL (ref 70–99)
Total Bilirubin: 0.7 mg/dL (ref 0.3–1.2)
Total Protein: 6.4 g/dL (ref 6.0–8.3)

## 2013-08-13 LAB — LIPID PANEL
Cholesterol: 162 mg/dL (ref 0–200)
HDL: 83 mg/dL (ref >39)
Total CHOL/HDL Ratio: 2 Ratio

## 2013-08-13 LAB — VITAMIN B12: Vitamin B-12: 237 pg/mL (ref 211–911)

## 2013-08-14 ENCOUNTER — Telehealth: Payer: Self-pay | Admitting: *Deleted

## 2013-08-14 ENCOUNTER — Ambulatory Visit: Payer: Medicare Other

## 2013-08-14 DIAGNOSIS — IMO0002 Reserved for concepts with insufficient information to code with codable children: Secondary | ICD-10-CM

## 2013-08-14 DIAGNOSIS — M545 Low back pain: Secondary | ICD-10-CM

## 2013-08-14 DIAGNOSIS — M2550 Pain in unspecified joint: Secondary | ICD-10-CM

## 2013-08-14 DIAGNOSIS — M6281 Muscle weakness (generalized): Secondary | ICD-10-CM

## 2013-08-14 LAB — VITAMIN D 25 HYDROXY (VIT D DEFICIENCY, FRACTURES): Vit D, 25-Hydroxy: 14 ng/mL — ABNORMAL LOW (ref 30–89)

## 2013-08-14 NOTE — Telephone Encounter (Signed)
Lab report faxed.Carrie Shannon

## 2013-08-21 ENCOUNTER — Encounter: Payer: Medicare Other | Admitting: Physical Therapy

## 2013-08-21 DIAGNOSIS — M545 Low back pain: Secondary | ICD-10-CM

## 2013-08-21 DIAGNOSIS — M2550 Pain in unspecified joint: Secondary | ICD-10-CM

## 2013-08-21 DIAGNOSIS — IMO0002 Reserved for concepts with insufficient information to code with codable children: Secondary | ICD-10-CM

## 2013-08-21 DIAGNOSIS — M6281 Muscle weakness (generalized): Secondary | ICD-10-CM

## 2013-08-22 ENCOUNTER — Ambulatory Visit (INDEPENDENT_AMBULATORY_CARE_PROVIDER_SITE_OTHER): Payer: Medicare Other | Admitting: Family Medicine

## 2013-08-22 ENCOUNTER — Encounter: Payer: Self-pay | Admitting: Family Medicine

## 2013-08-22 VITALS — BP 108/65 | HR 96 | Wt 147.0 lb

## 2013-08-22 DIAGNOSIS — R002 Palpitations: Secondary | ICD-10-CM

## 2013-08-22 DIAGNOSIS — Z0000 Encounter for general adult medical examination without abnormal findings: Secondary | ICD-10-CM

## 2013-08-22 MED ORDER — AMBULATORY NON FORMULARY MEDICATION: Status: DC

## 2013-08-22 NOTE — Patient Instructions (Addendum)
Keep up a regular exercise program and make sure you are eating a healthy diet Try to eat 4 servings of dairy a day, or if you are lactose intolerant take a calcium with vitamin D daily.  Your vaccines are up to date.  Please schedule an eye appointment.

## 2013-08-22 NOTE — Progress Notes (Signed)
Subjective:    Carrie Shannon is a 67 y.o. female who presents for Medicare Annual/Subsequent preventive examination.  Preventive Screening-Counseling & Management  Tobacco History  Smoking status  . Never Smoker   Smokeless tobacco  . Not on file     Problems Prior to Visit 1. Heart fluttters.  Happens every 2-3 weeks. Gets dizzy with it. Lasts a few seconds.  No known triggers. No CP with it.  No prior heart condition. o SOB.   Current Problems (verified) Patient Active Problem List   Diagnosis Date Noted  . Osteoarthritis of left knee 04/26/2013  . Left lumbar radiculitis 04/26/2013  . 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 UNSPECIFIED VITAMIN D DEFICIENCY 11/24/2010  . OSTEOPENIA 11/24/2010  . ANEMIA, PERNICIOUS 10/26/2010  . BARIATRIC SURGERY STATUS 10/20/2010    Medications Prior to Visit Current Outpatient Prescriptions on File Prior to Visit  Medication Sig Dispense Refill  . amitriptyline (ELAVIL) 25 MG tablet Take 25 mg by mouth at bedtime.      . gabapentin (NEURONTIN) 300 MG capsule One tab PO qHS for a week, then BID for a week, then TID. May double weekly to a max of 3,600mg /day  180 capsule  3  . meloxicam (MOBIC) 15 MG tablet Take 15 mg by mouth daily.      . pantoprazole (PROTONIX) 40 MG tablet Take 40 mg by mouth 2 (two) times daily.       No current facility-administered medications on file prior to visit.    Current Medications (verified) Current Outpatient Prescriptions  Medication Sig Dispense Refill  . amitriptyline (ELAVIL) 25 MG tablet Take 25 mg by mouth at bedtime.      . dicyclomine (BENTYL) 10 MG capsule Take 10 mg by mouth 4 (four) times daily -  before meals and at bedtime.      . gabapentin (NEURONTIN) 300 MG capsule One tab PO qHS for a week, then BID for a week, then TID. May double weekly to a max of 3,600mg /day  180 capsule  3  . meloxicam (MOBIC) 15 MG tablet Take 15 mg by mouth daily.      . pantoprazole (PROTONIX) 40 MG tablet  Take 40 mg by mouth 2 (two) times daily.       No current facility-administered medications for this visit.     Allergies (verified) Doxepin   PAST HISTORY  Family History Family History  Problem Relation Age of Onset  . Cancer Father     esophageal and lung cancer  . Heart attack Mother   . Stroke Mother 34    Social History History  Substance Use Topics  . Smoking status: Never Smoker   . Smokeless tobacco: Not on file  . Alcohol Use: No     Are there smokers in your home (other than you)? No  Risk Factors Current exercise habits: The patient does not participate in regular exercise at present. going to PT for her ack and her leg.   Dietary issues discussed: none   Cardiac risk factors: advanced age (older than 72 for men, 8 for women).  Depression Screen (Note: if answer to either of the following is "Yes", a more complete depression screening is indicated)   Over the past two weeks, have you felt down, depressed or hopeless? No  Over the past two weeks, have you felt little interest or pleasure in doing things? No  Have you lost interest or pleasure in daily life? No  Do you often feel hopeless? No  Do you cry easily over simple problems? No  Activities of Daily Living In your present state of health, do you have any difficulty performing the following activities?:  Driving? No Managing money?  No Feeding yourself? No Getting from bed to chair? No Climbing a flight of stairs? No Preparing food and eating?: No Bathing or showering? No Getting dressed: No Getting to the toilet? No Using the toilet:No Moving around from place to place: No In the past year have you fallen or had a near fall?:No   Are you sexually active?  Yes  Do you have more than one partner?  No  Hearing Difficulties: Yes Do you often ask people to speak up or repeat themselves? Yes Do you experience ringing or noises in your ears? Yes Do you have difficulty understanding soft or  whispered voices? Yes   Do you feel that you have a problem with memory? No  Do you often misplace items? No  Do you feel safe at home?  Yes  Cognitive Testing  Alert? Yes  Normal Appearance?Yes  Oriented to person? Yes  Place? Yes   Time? Yes  Recall of three objects?  Yes  Can perform simple calculations? Yes  Displays appropriate judgment?Yes  Can read the correct time from a watch face?Yes   Advanced Directives have been discussed with the patient? Yes  List the Names of Other Physician/Practitioners you currently use: 1.    Indicate any recent Medical Services you may have received from other than [facility] providers in the past year (date may be approximate).  Immunization History  Administered Date(s) Administered  . Influenza Whole 07/10/2010  . Influenza, High Dose Seasonal PF 07/11/2013  . Pneumococcal Polysaccharide 10/10/2005, 11/08/2011  . Td 10/11/2003    Screening Tests Health Maintenance  Topic Date Due  . Zostavax  06/18/2005  . Tetanus/tdap  10/10/2013  . Influenza Vaccine  05/10/2014  . Mammogram  01/23/2015  . Colonoscopy  12/31/2017  . Pneumococcal Polysaccharide Vaccine Age 7 And Over  Completed    All answers were reviewed with the patient and necessary referrals were made:  Carrie Pillars, MD   08/22/2013   History reviewed: allergies, current medications, past family history, past medical history, past social history, past surgical history and problem list  Review of Systems A comprehensive review of systems was negative.    Objective:     Vision by Snellen chart: right eye:20/40, left eye:20/30  Body mass index is 25.78 kg/(m^2). BP 108/65  Pulse 96  Wt 141 lb (63.957 kg)  BP 108/65  Pulse 96  Wt 147 lb (66.679 kg)  General Appearance:    Alert, cooperative, no distress, appears stated age  Head:    Normocephalic, without obvious abnormality, atraumatic  Eyes:    PERRL, conjunctiva/corneas clear, EOM's intact,, both eyes   Ears:    Normal TM's and external ear canals, both ears  Nose:   Nares normal, septum midline, mucosa normal, no drainage    or sinus tenderness  Throat:   Lips, mucosa, and tongue normal; teeth and gums normal  Neck:   Supple, symmetrical, trachea midline, no adenopathy;    thyroid:  no enlargement/tenderness/nodules; no carotid   bruit or JVD  Back:     Symmetric, no curvature, ROM normal, no CVA tenderness  Lungs:     Clear to auscultation bilaterally, respirations unlabored  Chest Wall:    No tenderness or deformity   Heart:    Regular rate and rhythm, S1 and S2 normal,  no murmur, rub   or gallop  Breast Exam:    Not performed  Abdomen:     Soft, non-tender, bowel sounds active all four quadrants,    no masses, no organomegaly  Genitalia:    Not performed  Rectal:    Not performed  Extremities:   Extremities normal, atraumatic, no cyanosis or edema  Pulses:   2+ and symmetric all extremities  Skin:   Skin color, texture, turgor normal, no rashes or lesions  Lymph nodes:   Cervical, supraclavicular nodes normal  Neurologic:   CNII-XII intact, normal strength, sensation and reflexes    throughout       Assessment:     Annual Medical Wellness Exam      Plan:     During the course of the visit the patient was educated and counseled about appropriate screening and preventive services including:    has had flu vaccine in Oct  Palpitations - EKG perfrormed today shows rate of 82 per minute per minute , left axis deviation  Nonpathologic. No acute ST-T wave weight changes. She does have poor R wave progression in the lateral leads as well as possible i told infarct in the inferior leads, lead 3 and aVF have Q waves.  Recommend eye exam.  These episodes typically occur about once every 2 weeks and lasts for a few seconds. We can certainly schedule her for a cardiac monitor. I think it would also be reasonable to keep an eye on the symptoms and if they are lasting longer or  becoming more frequent and to definitely call the office back so we can schedule her for a cardiac monitor.  Hx of vertigo. Says still gets sxs occasionally but brief episdoes.   Diet review for nutrition referral? Yes ____  Not Indicated _x__   Patient Instructions (the written plan) was given to the patient.  Medicare Attestation I have personally reviewed: The patient's medical and social history Their use of alcohol, tobacco or illicit drugs Their current medications and supplements The patient's functional ability including ADLs,fall risks, home safety risks, cognitive, and hearing and visual impairment Diet and physical activities Evidence for depression or mood disorders  The patient's weight, height, BMI, and visual acuity have been recorded in the chart.  I have made referrals, counseling, and provided education to the patient based on review of the above and I have provided the patient with a written personalized care plan for preventive services.     Trigo Winterbottom, MD   08/22/2013

## 2013-08-23 DIAGNOSIS — M6281 Muscle weakness (generalized): Secondary | ICD-10-CM

## 2013-08-23 DIAGNOSIS — M2550 Pain in unspecified joint: Secondary | ICD-10-CM

## 2013-08-23 DIAGNOSIS — M545 Low back pain: Secondary | ICD-10-CM

## 2013-08-23 DIAGNOSIS — IMO0002 Reserved for concepts with insufficient information to code with codable children: Secondary | ICD-10-CM

## 2013-08-26 ENCOUNTER — Encounter: Payer: Medicare Other | Admitting: Physical Therapy

## 2013-08-26 DIAGNOSIS — M2550 Pain in unspecified joint: Secondary | ICD-10-CM

## 2013-08-26 DIAGNOSIS — M6281 Muscle weakness (generalized): Secondary | ICD-10-CM

## 2013-08-26 DIAGNOSIS — M545 Low back pain: Secondary | ICD-10-CM

## 2013-08-28 ENCOUNTER — Ambulatory Visit (INDEPENDENT_AMBULATORY_CARE_PROVIDER_SITE_OTHER): Payer: Medicare Other | Admitting: Sports Medicine

## 2013-08-28 ENCOUNTER — Encounter: Payer: Self-pay | Admitting: Sports Medicine

## 2013-08-28 VITALS — BP 123/78 | HR 87 | Wt 148.0 lb

## 2013-08-28 DIAGNOSIS — M5416 Radiculopathy, lumbar region: Secondary | ICD-10-CM

## 2013-08-28 DIAGNOSIS — IMO0002 Reserved for concepts with insufficient information to code with codable children: Secondary | ICD-10-CM

## 2013-08-28 DIAGNOSIS — M1710 Unilateral primary osteoarthritis, unspecified knee: Secondary | ICD-10-CM

## 2013-08-28 DIAGNOSIS — M1712 Unilateral primary osteoarthritis, left knee: Secondary | ICD-10-CM

## 2013-08-28 NOTE — Assessment & Plan Note (Signed)
Doing well, no changes. She will come back if having recurrent pain and we can repeat a steroid shot.

## 2013-08-28 NOTE — Assessment & Plan Note (Signed)
Essentially resolved with gabapentin and physical therapy. Return as needed for this.

## 2013-08-28 NOTE — Progress Notes (Signed)
  Subjective:    CC:  Follow up  HPI: Knee osteoarthritis: Continues to do well after injection, she is having a little bit of pain, but not so much were she would consider repeat intervention.  Lumbar radiculitis: Left L5, doing very well with gabapentin, and after formal physical therapy, happy with results.  Past medical history, Surgical history, Family history not pertinant except as noted below, Social history, Allergies, and medications have been entered into the medical record, reviewed, and no changes needed.   Review of Systems: No fevers, chills, night sweats, weight loss, chest pain, or shortness of breath.   Objective:    General: Well Developed, well nourished, and in no acute distress.  Neuro: Alert and oriented x3, extra-ocular muscles intact, sensation grossly intact.  HEENT: Normocephalic, atraumatic, pupils equal round reactive to light, neck supple, no masses, no lymphadenopathy, thyroid nonpalpable.  Skin: Warm and dry, no rashes. Cardiac: Regular rate and rhythm, no murmurs rubs or gallops, no lower extremity edema.  Respiratory: Clear to auscultation bilaterally. Not using accessory muscles, speaking in full sentences.  Impression and Recommendations:

## 2013-09-02 ENCOUNTER — Encounter: Payer: Medicare Other | Admitting: Physical Therapy

## 2014-02-19 ENCOUNTER — Ambulatory Visit (INDEPENDENT_AMBULATORY_CARE_PROVIDER_SITE_OTHER): Payer: Medicare Other | Admitting: Family Medicine

## 2014-02-19 ENCOUNTER — Encounter: Payer: Self-pay | Admitting: Family Medicine

## 2014-02-19 VITALS — BP 121/82 | HR 77 | Ht 62.0 in | Wt 150.0 lb

## 2014-02-19 DIAGNOSIS — E559 Vitamin D deficiency, unspecified: Secondary | ICD-10-CM

## 2014-02-19 DIAGNOSIS — D510 Vitamin B12 deficiency anemia due to intrinsic factor deficiency: Secondary | ICD-10-CM

## 2014-02-19 DIAGNOSIS — Z9884 Bariatric surgery status: Secondary | ICD-10-CM | POA: Diagnosis not present

## 2014-02-19 DIAGNOSIS — Z23 Encounter for immunization: Secondary | ICD-10-CM

## 2014-02-19 LAB — FERRITIN: Ferritin: 6 ng/mL — ABNORMAL LOW (ref 10–291)

## 2014-02-19 LAB — VITAMIN B12: Vitamin B-12: 167 pg/mL — ABNORMAL LOW (ref 211–911)

## 2014-02-19 MED ORDER — AMBULATORY NON FORMULARY MEDICATION: Status: DC

## 2014-02-19 MED ORDER — CYANOCOBALAMIN 1000 MCG/ML IJ SOLN
1000.0000 ug | Freq: Once | INTRAMUSCULAR | Status: AC
  Administered 2014-02-19: 1000 ug via INTRAMUSCULAR

## 2014-02-19 MED ORDER — PHENTERMINE HCL 37.5 MG PO CAPS: 37.5000 mg | ORAL_CAPSULE | ORAL | Status: DC

## 2014-02-19 NOTE — Progress Notes (Signed)
Subjective:    Patient ID: Carrie Shannon, female    DOB: 1946-07-21, 68 y.o.   MRN: [id number]  HPI F/U bariatric surgery - she's not taking her multivitamin or any supplements. She has gained 10 pounds since starting the amitriptyline. In fact she said she gained it and a 2 to three-week period but she likes amitriptyline because it does help her rest much better. She wants and if there's something that she can take, a pill, help with weight loss. She does get on her treadmill in exercise daily.  Vit d def-she's not taking a supplement right now.  B12 def - she's not taking a supplement right now. She has been B12 injections in the past.   Review of Systems  BP 121/82  Pulse 77  Ht 5\' 2"  (1.575 m)  Wt 150 lb (68.04 kg)  BMI 27.43 kg/m2  SpO2 96%    Allergies  Allergen Reactions  . Doxepin Other (See Comments)    Headaches     Past Medical History  Diagnosis Date  . Hiatal hernia     EGD 10-27-10/[facility]  . Shoulder fracture, right   . Elbow fracture, right   . Wrist fracture, right   . AC (acromioclavicular) joint bone spurs     heels  . Esophageal ring     Past Surgical History  Procedure Laterality Date  . Cholecystectomy  2009  . Gastric bypass  2003  . Tubal ligation  1975  . Abdominal hysterectomy  1980's     Fibroids    History   Social History  . Marital Status: Married    Spouse Name: N/A    Number of Children: N/A  . Years of Education: N/A   Occupational History  . Not on file.   Social History Main Topics  . Smoking status: Never Smoker   . Smokeless tobacco: Not on file  . Alcohol Use: No  . Drug Use: No  . 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 Not on file   Social History Narrative  . No narrative on file    Family History  Problem Relation Age of Onset  . Cancer Father      esophageal and lung cancer  . Heart attack Mother   . Stroke Mother 39    Outpatient Encounter Prescriptions as of 02/19/2014  Medication Sig  . AMBULATORY NON FORMULARY MEDICATION Medication Name: Shingles vaccine IM x 1  . amitriptyline (ELAVIL) 25 MG tablet Take 25 mg by mouth at bedtime.  . dicyclomine (BENTYL) 10 MG capsule Take 10 mg by mouth 4 (four) times daily -  before meals and at bedtime.  . pantoprazole (PROTONIX) 40 MG tablet Take 40 mg by mouth 2 (two) times daily.  . AMBULATORY NON FORMULARY MEDICATION Medication Name: Tdap, IM x 1.  . AMBULATORY NON FORMULARY MEDICATION Medication Name: Zostavax IM  X 1  . phentermine 37.5 MG capsule Take 1 capsule (37.5 mg total) by mouth every morning.  . [DISCONTINUED] Cholecalciferol (VITAMIN D3) 400 UNITS tablet Take 400 Units by mouth daily.  . [DISCONTINUED] gabapentin (NEURONTIN) 300 MG capsule One tab PO qHS for a week, then BID for a week, then TID. May double weekly to a max of 3,600mg /day  . [DISCONTINUED] meloxicam (MOBIC) 15 MG tablet Take 15 mg by mouth daily.  . [  DISCONTINUED] vitamin B-12 (CYANOCOBALAMIN) 500 MCG tablet Take 500 mcg by mouth daily.  . [EXPIRED] cyanocobalamin ((VITAMIN B-12)) injection 1,000 mcg           Objective:   Physical Exam  Constitutional: She is oriented to person, place, and time. She appears well-developed and well-nourished.  HENT:  Head: Normocephalic and atraumatic.  Eyes: Conjunctivae and EOM are normal.  Cardiovascular: Normal rate, regular rhythm and normal heart sounds.   Pulmonary/Chest: Effort normal and breath sounds normal.  Neurological: She is alert and oriented to person, place, and time.  Skin: Skin is warm and dry. No pallor.  Psychiatric: She has a normal mood and affect. Her behavior is normal.          Assessment & Plan:  Hx of bariatric surgery.   Vit D def - we'll recheck levels today. Make sure she's taking 2000 international units daily. She has not  been on anything for several months.  Vi B12 def - we discussed different options. She says she really has a hard time taking pills regularly. We will give her B12 injection today and start monthly B12 shots.  Due fo Prevnar.  Given today. We also discussed need for shingles vaccine as well as tdap. Prescriptions given to that she can have these in her local pharmacy so that they can be found under her Medicare part D.  Overweight/BMI 27-discussed weight loss medication pros and cons and potential side effects. She has no prior cardiac problems and blood pressures very well controlled. She has a prior history of bariatric surgery. She's gained about 10 pounds on amitriptyline but says it really makes a difference in her sleep quality at night and would like to continue it. Followup in one month for nurse blood pressure and weight check.

## 2014-02-20 LAB — VITAMIN D 25 HYDROXY (VIT D DEFICIENCY, FRACTURES)

## 2014-08-18 DIAGNOSIS — Z23 Encounter for immunization: Secondary | ICD-10-CM | POA: Diagnosis not present

## 2014-08-22 ENCOUNTER — Encounter: Payer: Self-pay | Admitting: Family Medicine

## 2014-08-22 ENCOUNTER — Ambulatory Visit (INDEPENDENT_AMBULATORY_CARE_PROVIDER_SITE_OTHER): Payer: Medicare Other | Admitting: Family Medicine

## 2014-08-22 ENCOUNTER — Other Ambulatory Visit: Payer: Self-pay | Admitting: Family Medicine

## 2014-08-22 VITALS — BP 101/65 | HR 82 | Temp 98.1°F | Ht 62.0 in | Wt 142.0 lb

## 2014-08-22 DIAGNOSIS — Z9884 Bariatric surgery status: Secondary | ICD-10-CM

## 2014-08-22 DIAGNOSIS — Z1231 Encounter for screening mammogram for malignant neoplasm of breast: Secondary | ICD-10-CM

## 2014-08-22 DIAGNOSIS — R112 Nausea with vomiting, unspecified: Secondary | ICD-10-CM

## 2014-08-22 DIAGNOSIS — R1013 Epigastric pain: Secondary | ICD-10-CM

## 2014-08-22 LAB — CBC WITH DIFFERENTIAL/PLATELET
Basophils Absolute: 0.1 10*3/uL (ref 0.0–0.1)
Basophils Relative: 1 % (ref 0–1)
EOS ABS: 0.2 10*3/uL (ref 0.0–0.7)
EOS PCT: 3 % (ref 0–5)
HCT: 34.1 % — ABNORMAL LOW (ref 36.0–46.0)
HEMOGLOBIN: 10.8 g/dL — AB (ref 12.0–15.0)
LYMPHS ABS: 2.4 10*3/uL (ref 0.7–4.0)
Lymphocytes Relative: 35 % (ref 12–46)
MCH: 26.2 pg (ref 26.0–34.0)
MCHC: 31.7 g/dL (ref 30.0–36.0)
MCV: 82.6 fL (ref 78.0–100.0)
MONOS PCT: 9 % (ref 3–12)
Monocytes Absolute: 0.6 10*3/uL (ref 0.1–1.0)
NEUTROS PCT: 52 % (ref 43–77)
Neutro Abs: 3.5 10*3/uL (ref 1.7–7.7)
PLATELETS: 450 10*3/uL — AB (ref 150–400)
RBC: 4.13 MIL/uL (ref 3.87–5.11)
RDW: 15.3 % (ref 11.5–15.5)
WBC: 6.8 10*3/uL (ref 4.0–10.5)

## 2014-08-22 LAB — LIPID PANEL
CHOL/HDL RATIO: 1.7 ratio
Cholesterol: 146 mg/dL (ref 0–200)
HDL: 86 mg/dL (ref >39)
LDL Cholesterol: 43 mg/dL (ref 0–99)
TRIGLYCERIDES: 84 mg/dL (ref <150)
VLDL: 17 mg/dL (ref 0–40)

## 2014-08-22 LAB — COMPLETE METABOLIC PANEL WITH GFR
ALT: 11 U/L (ref 0–35)
AST: 16 U/L (ref 0–37)
Albumin: 3.8 g/dL (ref 3.5–5.2)
Alkaline Phosphatase: 100 U/L (ref 39–117)
BUN: 9 mg/dL (ref 6–23)
CO2: 23 mEq/L (ref 19–32)
CREATININE: 0.62 mg/dL (ref 0.50–1.10)
Calcium: 9.2 mg/dL (ref 8.4–10.5)
Chloride: 107 mEq/L (ref 96–112)
GFR, Est African American: >89 mL/min
GLUCOSE: 89 mg/dL (ref 70–99)
Potassium: 4.6 mEq/L (ref 3.5–5.3)
Sodium: 142 mEq/L (ref 135–145)
Total Bilirubin: 0.7 mg/dL (ref 0.2–1.2)
Total Protein: 6.3 g/dL (ref 6.0–8.3)

## 2014-08-22 LAB — AMYLASE: Amylase: 58 U/L (ref 0–105)

## 2014-08-22 LAB — VITAMIN B12: VITAMIN B 12: 255 pg/mL (ref 211–911)

## 2014-08-22 LAB — LIPASE: Lipase: <10 U/L (ref 0–75)

## 2014-08-22 LAB — MAGNESIUM: MAGNESIUM: 2 mg/dL (ref 1.5–2.5)

## 2014-08-22 LAB — FERRITIN: Ferritin: 9 ng/mL — ABNORMAL LOW (ref 10–291)

## 2014-08-22 MED ORDER — AMBULATORY NON FORMULARY MEDICATION: Status: DC

## 2014-08-22 MED ORDER — PANTOPRAZOLE SODIUM 40 MG PO TBEC: 40.0000 mg | DELAYED_RELEASE_TABLET | Freq: Two times a day (BID) | ORAL | Status: DC

## 2014-08-22 NOTE — Progress Notes (Signed)
Subjective:    Patient ID: Carrie Shannon, female    DOB: 16-Aug-1946, 68 y.o.   MRN: [id number]  HPI She just got back to the area about a week ago. She has been traveling all summer.  Every time she eats she has projective vomiting over the summer.  GB has been removed. Hx of mini gastric bypass.  No fever, chills.  No blood in the stool.  No esophageal pain. Pain is mostly in the epigastric.  Rarely has heartburn.  Hx of hiatal hernia.  Has lost about 6 lbs. She has been having some urgency with her stools as well. No blood in the stool.  No worsening or alleviating factors. She is not on any antacids or PPI's.   Review of Systems  BP 101/65 mmHg  Pulse 82  Temp(Src) 98.1 F (36.7 C)  Ht 5\' 2"  (1.575 m)  Wt 142 lb (64.411 kg)  BMI 25.97 kg/m2  SpO2 95%    Allergies  Allergen Reactions  . Doxepin Other (See Comments)    Headaches     Past Medical History  Diagnosis Date  . Hiatal hernia     EGD 10-27-10/[facility]  . Shoulder fracture, right   . Elbow fracture, right   . Wrist fracture, right   . AC (acromioclavicular) joint bone spurs     heels  . Esophageal ring     Past Surgical History  Procedure Laterality Date  . Cholecystectomy  2009  . Gastric bypass  2003  . Tubal ligation  1975  . Abdominal hysterectomy  1980's     Fibroids    History   Social History  . Marital Status: Married    Spouse Name: N/A    Number of Children: N/A  . Years of Education: N/A   Occupational History  . Not on file.   Social History Main Topics  . Smoking status: Never Smoker   . Smokeless tobacco: Not on file  . Alcohol Use: No  . Drug Use: No  . 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 Not on file   Social History Narrative    Family History  Problem Relation Age of Onset  . Cancer Father    esophageal and lung cancer  . Heart attack Mother   . Stroke Mother 26    Outpatient Encounter Prescriptions as of 08/22/2014  Medication Sig  . dicyclomine (BENTYL) 10 MG capsule Take 10 mg by mouth 4 (four) times daily -  before meals and at bedtime.  . pantoprazole (PROTONIX) 40 MG tablet Take 1 tablet (40 mg total) by mouth 2 (two) times daily.  . [DISCONTINUED] pantoprazole (PROTONIX) 40 MG tablet Take 40 mg by mouth 2 (two) times daily.  . AMBULATORY NON FORMULARY MEDICATION Medication Name: Zostavax IM  X 1  . AMBULATORY NON FORMULARY MEDICATION Medication Name: Tdap, IM x 1.  . amitriptyline (ELAVIL) 25 MG tablet Take 25 mg by mouth at bedtime.  . phentermine 37.5 MG capsule Take 1 capsule (37.5 mg total) by mouth every morning.  . [DISCONTINUED] AMBULATORY NON FORMULARY MEDICATION Medication Name: Shingles vaccine IM x 1  . [DISCONTINUED] AMBULATORY NON FORMULARY MEDICATION Medication Name: Tdap, IM x 1.  . [DISCONTINUED] AMBULATORY NON FORMULARY MEDICATION Medication Name: Zostavax IM  X 1  . [DISCONTINUED] FLUZONE HIGH-DOSE 0.5 ML SUSY   . [  DISCONTINUED] PREVNAR 13 SUSP injection           Objective:   Physical Exam  Constitutional: She is oriented to person, place, and time. She appears well-developed and well-nourished.  HENT:  Head: Normocephalic and atraumatic.  Cardiovascular: Normal rate, regular rhythm and normal heart sounds.   Pulmonary/Chest: Effort normal and breath sounds normal.  Abdominal: Soft. Bowel sounds are normal. She exhibits no distension and no mass. There is tenderness. There is no rebound and no guarding.  +TTP in the LUQ  Neurological: She is alert and oriented to person, place, and time.  Skin: Skin is warm and dry.  Psychiatric: She has a normal mood and affect. Her behavior is normal.          Assessment & Plan:  Vomiting/Nausea - unclear etiology. I would like for her to start a PPI just to help with symptoms. We will check for  pancreatitis today since she does have some left upper quadrant tenderness on exam. Called results once available. We'll go ahead and refer her to [facility] For further valuation.  Because of history about gastric bypass surgery will go ahead and check B12 ferritin and magnesium today as well.

## 2014-08-25 DIAGNOSIS — K580 Irritable bowel syndrome with diarrhea: Secondary | ICD-10-CM | POA: Diagnosis not present

## 2014-08-25 DIAGNOSIS — R1013 Epigastric pain: Secondary | ICD-10-CM | POA: Diagnosis not present

## 2014-08-25 DIAGNOSIS — R112 Nausea with vomiting, unspecified: Secondary | ICD-10-CM | POA: Diagnosis not present

## 2014-08-27 DIAGNOSIS — K580 Irritable bowel syndrome with diarrhea: Secondary | ICD-10-CM | POA: Diagnosis not present

## 2014-08-27 DIAGNOSIS — R112 Nausea with vomiting, unspecified: Secondary | ICD-10-CM | POA: Diagnosis not present

## 2014-08-28 ENCOUNTER — Ambulatory Visit (INDEPENDENT_AMBULATORY_CARE_PROVIDER_SITE_OTHER): Payer: Medicare Other

## 2014-08-28 DIAGNOSIS — Z1231 Encounter for screening mammogram for malignant neoplasm of breast: Secondary | ICD-10-CM

## 2014-08-29 DIAGNOSIS — K283 Acute gastrojejunal ulcer without hemorrhage or perforation: Secondary | ICD-10-CM | POA: Diagnosis not present

## 2014-08-29 DIAGNOSIS — K449 Diaphragmatic hernia without obstruction or gangrene: Secondary | ICD-10-CM | POA: Diagnosis not present

## 2014-08-29 DIAGNOSIS — K279 Peptic ulcer, site unspecified, unspecified as acute or chronic, without hemorrhage or perforation: Secondary | ICD-10-CM | POA: Diagnosis not present

## 2014-09-06 DIAGNOSIS — K088 Other specified disorders of teeth and supporting structures: Secondary | ICD-10-CM | POA: Diagnosis not present

## 2014-09-26 DIAGNOSIS — R1013 Epigastric pain: Secondary | ICD-10-CM | POA: Diagnosis not present

## 2014-09-26 DIAGNOSIS — A047 Enterocolitis due to Clostridium difficile: Secondary | ICD-10-CM | POA: Diagnosis not present

## 2014-09-26 DIAGNOSIS — R112 Nausea with vomiting, unspecified: Secondary | ICD-10-CM | POA: Diagnosis not present

## 2014-09-30 DIAGNOSIS — R1013 Epigastric pain: Secondary | ICD-10-CM | POA: Diagnosis not present

## 2014-09-30 DIAGNOSIS — K529 Noninfective gastroenteritis and colitis, unspecified: Secondary | ICD-10-CM | POA: Diagnosis not present

## 2014-09-30 DIAGNOSIS — K289 Gastrojejunal ulcer, unspecified as acute or chronic, without hemorrhage or perforation: Secondary | ICD-10-CM | POA: Diagnosis not present

## 2014-09-30 DIAGNOSIS — R1111 Vomiting without nausea: Secondary | ICD-10-CM | POA: Diagnosis not present

## 2014-10-14 ENCOUNTER — Encounter: Payer: Self-pay | Admitting: Family Medicine

## 2014-11-07 DIAGNOSIS — R1111 Vomiting without nausea: Secondary | ICD-10-CM | POA: Diagnosis not present

## 2014-11-07 DIAGNOSIS — K449 Diaphragmatic hernia without obstruction or gangrene: Secondary | ICD-10-CM | POA: Diagnosis not present

## 2014-11-07 DIAGNOSIS — A047 Enterocolitis due to Clostridium difficile: Secondary | ICD-10-CM | POA: Diagnosis not present

## 2014-12-30 ENCOUNTER — Ambulatory Visit (INDEPENDENT_AMBULATORY_CARE_PROVIDER_SITE_OTHER): Payer: Medicare Other | Admitting: Family Medicine

## 2014-12-30 ENCOUNTER — Encounter: Payer: Self-pay | Admitting: Family Medicine

## 2014-12-30 VITALS — BP 121/79 | HR 75 | Temp 97.8°F | Wt 131.0 lb

## 2014-12-30 DIAGNOSIS — R079 Chest pain, unspecified: Secondary | ICD-10-CM | POA: Diagnosis not present

## 2014-12-30 DIAGNOSIS — R1012 Left upper quadrant pain: Secondary | ICD-10-CM

## 2014-12-30 LAB — COMPREHENSIVE METABOLIC PANEL
ALK PHOS: 90 U/L (ref 39–117)
ALT: 11 U/L (ref 0–35)
AST: 15 U/L (ref 0–37)
Albumin: 3.9 g/dL (ref 3.5–5.2)
BUN: 12 mg/dL (ref 6–23)
CO2: 25 meq/L (ref 19–32)
CREATININE: 0.53 mg/dL (ref 0.50–1.10)
Calcium: 8.8 mg/dL (ref 8.4–10.5)
Chloride: 106 mEq/L (ref 96–112)
GLUCOSE: 84 mg/dL (ref 70–99)
Potassium: 4 mEq/L (ref 3.5–5.3)
Sodium: 140 mEq/L (ref 135–145)
TOTAL PROTEIN: 6.1 g/dL (ref 6.0–8.3)
Total Bilirubin: 0.7 mg/dL (ref 0.2–1.2)

## 2014-12-30 LAB — LIPASE

## 2014-12-30 MED ORDER — SUCRALFATE 1 G PO TABS: 1.0000 g | ORAL_TABLET | Freq: Three times a day (TID) | ORAL | Status: DC

## 2014-12-30 NOTE — Progress Notes (Signed)
CC: Carrie Shannon is a 69 y.o. female is here for LUQ pain   Subjective: HPI:   complains of left-sided chest pain and left upper quadrant pain that has been present for matter of years but over the past month seems to be more problematic. She tells me that she " feels like I'm dying."  Pain is improved with a GI cocktail she's also been taking Protonix on a daily basis for at least a month now. Pain is worse when eating anything other than chicken. She tells me that there is an exertional component to her pain where whenever she is active the pain seems to get worse. It radiates  Back and forth into the left chest and left upper quadrant. She's had some loose stools but she tells me that this is normal for her and has been present for matter of years. She doesn't she occasionally has shortness of breath with chest pain is present. She denies  Any productive cough or feelings of being sick with any respiratory illnesses.  She's had difficulty with regurgitation however this is been present for matter of years and over the past months seems to be under control and less frequent. She denies fevers, chills, unintentional weight gain or loss. The pain occasionally wakes her up in the middle the night. No fevers, chills, unintentional weight loss or gain. No other gastric has no complaints and no genitourinary complaints.     Review Of Systems Outlined In HPI  Past Medical History  Diagnosis Date  . Hiatal hernia     EGD 10-27-10/[facility]  . Shoulder fracture, right   . Elbow fracture, right   . Wrist fracture, right   . AC (acromioclavicular) joint bone spurs     heels  . Esophageal ring     Past Surgical History  Procedure Laterality Date  . Cholecystectomy  2009  . Gastric bypass  2003  . Tubal ligation  1975  . Abdominal hysterectomy  1980's     Fibroids   Family History  Problem Relation Age of Onset  . Cancer Father     esophageal and lung cancer  . Heart  attack Mother   . Stroke Mother 27    History   Social History  . Marital Status: Married    Spouse Name: N/A  . Number of Children: N/A  . Years of Education: N/A   Occupational History  . Not on file.   Social History Main Topics  . Smoking status: Never Smoker   . Smokeless tobacco: Not on file  . Alcohol Use: No  . Drug Use: No  . 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 Not on file   Social History Narrative     Objective: BP 121/79 mmHg  Pulse 75  Temp(Src) 97.8 F (36.6 C) (Oral)  Wt 131 lb (59.421 kg)  SpO2 98%  General: Alert and Oriented, No Acute Distress HEENT: Pupils equal, round, reactive to light. Conjunctivae clear.  Moist mucous membranes pharynx unremarkable Lungs: Clear to auscultation bilaterally, no wheezing/ronchi/rales.  Comfortable work of breathing. Good air movement. Cardiac: Regular rate and rhythm. Normal S1/S2.  No murmurs, rubs, nor gallops.  No reproduction of chest wall pain with palpation of the sternum or just left of the sternum. Abdomen: Normal bowel sounds, soft without palpable masses. Left upper  quadrant and epigastric tenderness to deep palpation without rebound or guarding. Extremities: No peripheral edema.  Strong peripheral pulses.  Mental Status: No depression, anxiety, nor agitation. Skin: Warm and dry.  Assessment & Plan: Carrie Shannon was seen today for luq pain.  Diagnoses and all orders for this visit:  Abdominal pain, left upper quadrant Orders: -     Lipase -     Comp Met (CMET)  Left sided chest pain Orders: -     EKG 12-Lead  Other orders -     sucralfate (CARAFATE) 1 G tablet; Take 1 tablet (1 g total) by mouth 4 (four) times daily -  with meals and at bedtime.   Given the component of chest pain and EKG was obtained and captured at the time that her pain was present.  Normal sinus rhythm, normal axis, no pathologic Q waves nor ST segment elevation or depression. Her EKG is overall unchanged from 2014. Low suspicion that her discomfort is coming from a cardiac etiology. Suspect pain is coming from esophagus or remaining stomach therefore continue Protonix adding Carafate and have encouraged her to follow-up with her gastroenterologist if this does not improve in a few days. Rule out hepatitis and pancreatitis with the above labs.  25 minutes spent face-to-face during visit today of which at least 50% was counseling or coordinating care regarding: 1. Abdominal pain, left upper quadrant   2. Left sided chest pain      Return if symptoms worsen or fail to improve.  

## 2015-02-02 DIAGNOSIS — K921 Melena: Secondary | ICD-10-CM | POA: Diagnosis not present

## 2015-02-02 DIAGNOSIS — R1013 Epigastric pain: Secondary | ICD-10-CM | POA: Diagnosis not present

## 2015-02-02 DIAGNOSIS — R1111 Vomiting without nausea: Secondary | ICD-10-CM | POA: Diagnosis not present

## 2015-03-05 DIAGNOSIS — R634 Abnormal weight loss: Secondary | ICD-10-CM | POA: Diagnosis not present

## 2015-03-05 DIAGNOSIS — R1013 Epigastric pain: Secondary | ICD-10-CM | POA: Diagnosis not present

## 2015-03-05 DIAGNOSIS — R110 Nausea: Secondary | ICD-10-CM | POA: Diagnosis not present

## 2015-03-06 DIAGNOSIS — K760 Fatty (change of) liver, not elsewhere classified: Secondary | ICD-10-CM | POA: Diagnosis not present

## 2015-03-06 DIAGNOSIS — Z9884 Bariatric surgery status: Secondary | ICD-10-CM | POA: Diagnosis not present

## 2015-03-06 DIAGNOSIS — N281 Cyst of kidney, acquired: Secondary | ICD-10-CM | POA: Diagnosis not present

## 2015-03-06 DIAGNOSIS — K5790 Diverticulosis of intestine, part unspecified, without perforation or abscess without bleeding: Secondary | ICD-10-CM | POA: Diagnosis not present

## 2015-04-10 DIAGNOSIS — R634 Abnormal weight loss: Secondary | ICD-10-CM | POA: Diagnosis not present

## 2015-04-10 DIAGNOSIS — R1013 Epigastric pain: Secondary | ICD-10-CM | POA: Diagnosis not present

## 2015-04-10 DIAGNOSIS — R1310 Dysphagia, unspecified: Secondary | ICD-10-CM | POA: Diagnosis not present

## 2015-04-10 DIAGNOSIS — R1111 Vomiting without nausea: Secondary | ICD-10-CM | POA: Diagnosis not present

## 2015-04-23 DIAGNOSIS — R634 Abnormal weight loss: Secondary | ICD-10-CM | POA: Diagnosis not present

## 2015-04-23 DIAGNOSIS — R1013 Epigastric pain: Secondary | ICD-10-CM | POA: Diagnosis not present

## 2015-04-23 DIAGNOSIS — R1111 Vomiting without nausea: Secondary | ICD-10-CM | POA: Diagnosis not present

## 2015-06-02 ENCOUNTER — Ambulatory Visit (INDEPENDENT_AMBULATORY_CARE_PROVIDER_SITE_OTHER): Payer: Medicare Other | Admitting: Family Medicine

## 2015-06-02 ENCOUNTER — Encounter: Payer: Self-pay | Admitting: Family Medicine

## 2015-06-02 VITALS — BP 129/82 | HR 65 | Temp 98.3°F | Wt 122.0 lb

## 2015-06-02 DIAGNOSIS — N3001 Acute cystitis with hematuria: Secondary | ICD-10-CM | POA: Diagnosis not present

## 2015-06-02 DIAGNOSIS — N390 Urinary tract infection, site not specified: Secondary | ICD-10-CM | POA: Insufficient documentation

## 2015-06-02 DIAGNOSIS — R300 Dysuria: Secondary | ICD-10-CM

## 2015-06-02 LAB — POCT URINALYSIS DIPSTICK
Bilirubin, UA: NEGATIVE
Glucose, UA: NEGATIVE
KETONES UA: NEGATIVE
Nitrite, UA: POSITIVE
PH UA: 5.5
PROTEIN UA: NEGATIVE
SPEC GRAV UA: 1.02
UROBILINOGEN UA: 1

## 2015-06-02 MED ORDER — CEPHALEXIN 500 MG PO CAPS: 500.0000 mg | ORAL_CAPSULE | Freq: Two times a day (BID) | ORAL | Status: DC

## 2015-06-02 NOTE — Progress Notes (Signed)
Carrie Shannon is a 69 y.o. female who presents to [facility]  today for urinary frequency urgency and dysuria. Symptoms present for about a week. Symptoms are consistent with previous episodes of UTI. She was treated for UTI about a month ago at her gastroenterologist office. She cannot recall which medication it was. She denies any significant fevers chills nausea vomiting diarrhea or abdominal pain. She notes mild low back pain. She feels well otherwise.  She has tried some AZO which has helped. She lasted yesterday.   Past Medical History  Diagnosis Date  . Hiatal hernia     EGD 10-27-10/[facility]  . Shoulder fracture, right   . Elbow fracture, right   . Wrist fracture, right   . AC (acromioclavicular) joint bone spurs     heels  . Esophageal ring    Past Surgical History  Procedure Laterality Date  . Cholecystectomy  2009  . Gastric bypass  2003  . Tubal ligation  1975  . Abdominal hysterectomy  1980's     Fibroids   Social History  Substance Use Topics  . Smoking status: Never Smoker   . Smokeless tobacco: Not on file  . Alcohol Use: No   ROS as above Medications: Current Outpatient Prescriptions  Medication Sig Dispense Refill  . pantoprazole (PROTONIX) 40 MG tablet Take 1 tablet (40 mg total) by mouth 2 (two) times daily. 60 tablet 1  . cephALEXin (KEFLEX) 500 MG capsule Take 1 capsule (500 mg total) by mouth 2 (two) times daily. 14 capsule 0   No current facility-administered medications for this visit.   Allergies  Allergen Reactions  . Doxepin Other (See Comments)    Headaches      Exam:  BP 129/82 mmHg  Pulse 65  Temp(Src) 98.3 F (36.8 C)  Wt 122 lb (55.339 kg) Gen: Well NAD HEENT: EOMI,  MMM Lungs: Normal work of breathing. CTABL Heart: RRR no MRG Abd: NABS, Soft. Nondistended, Nontender no CV angle tenderness to percussion. Exts: Brisk capillary refill, warm and well perfused.    Results for orders placed or performed in visit on 06/02/15 (from the past 24 hour(s))  POCT Urinalysis Dipstick     Status: Abnormal   Collection Time: 06/02/15  8:37 AM  Result Value Ref Range   Color, UA yellow    Clarity, UA clear    Glucose, UA neg    Bilirubin, UA neg    Ketones, UA neg    Spec Grav, UA 1.020    Blood, UA small    pH, UA 5.5    Protein, UA neg    Urobilinogen, UA 1.0    Nitrite, UA pos    Leukocytes, UA large (3+) (A) Negative   No results found.   Please see individual assessment and plan sections.

## 2015-06-02 NOTE — Assessment & Plan Note (Signed)
UTI present. UTI is recurrent. Will treat with keflex. UCx pending.

## 2015-06-02 NOTE — Patient Instructions (Signed)
Thank you for coming in today. If your belly pain worsens, or you have high fever, bad vomiting, blood in your stool or black tarry stool go to the Emergency Room.  Take keflex as directed.   Urinary Tract Infection Urinary tract infections (UTIs) can develop anywhere along your urinary tract. Your urinary tract is your body's drainage system for removing wastes and extra water. Your urinary tract includes two kidneys, two ureters, a bladder, and a urethra. Your kidneys are a pair of bean-shaped organs. Each kidney is about the size of your fist. They are located below your ribs, one on each side of your spine. CAUSES Infections are caused by microbes, which are microscopic organisms, including fungi, viruses, and bacteria. These organisms are so small that they can only be seen through a microscope. Bacteria are the microbes that most commonly cause UTIs. SYMPTOMS  Symptoms of UTIs may vary by age and gender of the patient and by the location of the infection. Symptoms in young women typically include a frequent and intense urge to urinate and a painful, burning feeling in the bladder or urethra during urination. Older women and men are more likely to be tired, shaky, and weak and have muscle aches and abdominal pain. A fever may mean the infection is in your kidneys. Other symptoms of a kidney infection include pain in your back or sides below the ribs, nausea, and vomiting. DIAGNOSIS To diagnose a UTI, your caregiver will ask you about your symptoms. Your caregiver also will ask to provide a urine sample. The urine sample will be tested for bacteria and white blood cells. White blood cells are made by your body to help fight infection. TREATMENT  Typically, UTIs can be treated with medication. Because most UTIs are caused by a bacterial infection, they usually can be treated with the use of antibiotics. The choice of antibiotic and length of treatment depend on your symptoms and the type of bacteria  causing your infection. HOME CARE INSTRUCTIONS  If you were prescribed antibiotics, take them exactly as your caregiver instructs you. Finish the medication even if you feel better after you have only taken some of the medication.  Drink enough water and fluids to keep your urine clear or pale yellow.  Avoid caffeine, tea, and carbonated beverages. They tend to irritate your bladder.  Empty your bladder often. Avoid holding urine for long periods of time.  Empty your bladder before and after sexual intercourse.  After a bowel movement, women should cleanse from front to back. Use each tissue only once. SEEK MEDICAL CARE IF:   You have back pain.  You develop a fever.  Your symptoms do not begin to resolve within 3 days. SEEK IMMEDIATE MEDICAL CARE IF:   You have severe back pain or lower abdominal pain.  You develop chills.  You have nausea or vomiting.  You have continued burning or discomfort with urination. MAKE SURE YOU:   Understand these instructions.  Will watch your condition.  Will get help right away if you are not doing well or get worse. Document Released: 07/06/2005 Document Revised: 03/27/2012 Document Reviewed: 11/04/2011 [facility] Patient Information 2015 [place], [place]. This information is not intended to replace advice given to you by your health care provider. Make sure you discuss any questions you have with your health care provider.

## 2015-06-05 LAB — URINE CULTURE: Colony Count: >=100000

## 2015-06-05 NOTE — Progress Notes (Signed)
Quick Note:  Normal, no changes. How is patient doing? ______

## 2015-07-20 DIAGNOSIS — R1013 Epigastric pain: Secondary | ICD-10-CM | POA: Diagnosis not present

## 2015-07-20 DIAGNOSIS — R112 Nausea with vomiting, unspecified: Secondary | ICD-10-CM | POA: Diagnosis not present

## 2015-08-07 DIAGNOSIS — Z23 Encounter for immunization: Secondary | ICD-10-CM | POA: Diagnosis not present

## 2015-11-18 DIAGNOSIS — R1013 Epigastric pain: Secondary | ICD-10-CM | POA: Diagnosis not present

## 2015-11-18 DIAGNOSIS — R112 Nausea with vomiting, unspecified: Secondary | ICD-10-CM | POA: Diagnosis not present

## 2015-12-18 ENCOUNTER — Encounter: Payer: Self-pay | Admitting: *Deleted

## 2016-02-24 ENCOUNTER — Other Ambulatory Visit: Payer: Self-pay | Admitting: Family Medicine

## 2016-02-24 ENCOUNTER — Ambulatory Visit (INDEPENDENT_AMBULATORY_CARE_PROVIDER_SITE_OTHER): Payer: Medicare Other

## 2016-02-24 DIAGNOSIS — Z1231 Encounter for screening mammogram for malignant neoplasm of breast: Secondary | ICD-10-CM

## 2016-06-20 DIAGNOSIS — Z23 Encounter for immunization: Secondary | ICD-10-CM | POA: Diagnosis not present

## 2016-08-18 ENCOUNTER — Telehealth: Payer: Self-pay | Admitting: Family Medicine

## 2016-08-18 DIAGNOSIS — Z1159 Encounter for screening for other viral diseases: Secondary | ICD-10-CM

## 2016-08-18 DIAGNOSIS — D510 Vitamin B12 deficiency anemia due to intrinsic factor deficiency: Secondary | ICD-10-CM

## 2016-08-18 DIAGNOSIS — Z0000 Encounter for general adult medical examination without abnormal findings: Secondary | ICD-10-CM

## 2016-08-18 DIAGNOSIS — E559 Vitamin D deficiency, unspecified: Secondary | ICD-10-CM

## 2016-08-18 DIAGNOSIS — M949 Disorder of cartilage, unspecified: Secondary | ICD-10-CM

## 2016-08-18 DIAGNOSIS — M899 Disorder of bone, unspecified: Secondary | ICD-10-CM

## 2016-08-18 NOTE — Telephone Encounter (Signed)
Pt scheduled a cpe for jan.11 and would like to know if blood work can be ordered before hand so she can go ahead and get it done. Thanks

## 2016-08-18 NOTE — Telephone Encounter (Signed)
Called pt and informed her that labs have been ordered. Also informed her of Hep c testing asked if she was ok with this being added she stated that she was as long as medicare will pay. I informed her that we have not had any push back from medicare not paying for this test. .Carrie Shannon

## 2016-09-16 ENCOUNTER — Other Ambulatory Visit: Payer: Self-pay

## 2016-10-20 ENCOUNTER — Encounter: Payer: No Typology Code available for payment source | Admitting: Family Medicine

## 2016-11-01 DIAGNOSIS — Z0000 Encounter for general adult medical examination without abnormal findings: Secondary | ICD-10-CM | POA: Diagnosis not present

## 2016-11-01 DIAGNOSIS — M899 Disorder of bone, unspecified: Secondary | ICD-10-CM | POA: Diagnosis not present

## 2016-11-01 DIAGNOSIS — Z1159 Encounter for screening for other viral diseases: Secondary | ICD-10-CM | POA: Diagnosis not present

## 2016-11-01 DIAGNOSIS — M949 Disorder of cartilage, unspecified: Secondary | ICD-10-CM | POA: Diagnosis not present

## 2016-11-01 DIAGNOSIS — D510 Vitamin B12 deficiency anemia due to intrinsic factor deficiency: Secondary | ICD-10-CM | POA: Diagnosis not present

## 2016-11-01 DIAGNOSIS — E559 Vitamin D deficiency, unspecified: Secondary | ICD-10-CM | POA: Diagnosis not present

## 2016-11-01 LAB — CBC WITH DIFFERENTIAL/PLATELET
BASOS PCT: 1 %
Basophils Absolute: 55 cells/uL (ref 0–200)
EOS PCT: 3 %
Eosinophils Absolute: 165 cells/uL (ref 15–500)
HCT: 36.4 % (ref 35.0–45.0)
HEMOGLOBIN: 11.3 g/dL — AB (ref 11.7–15.5)
LYMPHS ABS: 1980 {cells}/uL (ref 850–3900)
Lymphocytes Relative: 36 %
MCH: 27.5 pg (ref 27.0–33.0)
MCHC: 31 g/dL — AB (ref 32.0–36.0)
MCV: 88.6 fL (ref 80.0–100.0)
MPV: 9.7 fL (ref 7.5–12.5)
Monocytes Absolute: 495 cells/uL (ref 200–950)
Monocytes Relative: 9 %
NEUTROS ABS: 2805 {cells}/uL (ref 1500–7800)
Neutrophils Relative %: 51 %
Platelets: 354 10*3/uL (ref 140–400)
RBC: 4.11 MIL/uL (ref 3.80–5.10)
RDW: 15.1 % — ABNORMAL HIGH (ref 11.0–15.0)
WBC: 5.5 10*3/uL (ref 3.8–10.8)

## 2016-11-02 LAB — COMPLETE METABOLIC PANEL WITH GFR
ALT: 10 U/L (ref 6–29)
AST: 17 U/L (ref 10–35)
Albumin: 3.8 g/dL (ref 3.6–5.1)
Alkaline Phosphatase: 81 U/L (ref 33–130)
BILIRUBIN TOTAL: 0.7 mg/dL (ref 0.2–1.2)
BUN: 12 mg/dL (ref 7–25)
CO2: 27 mmol/L (ref 20–31)
Calcium: 9.2 mg/dL (ref 8.6–10.4)
Chloride: 105 mmol/L (ref 98–110)
Creat: 0.54 mg/dL — ABNORMAL LOW (ref 0.60–0.93)
GFR, Est Non African American: >89 mL/min (ref >=60)
Glucose, Bld: 87 mg/dL (ref 65–99)
Potassium: 4.4 mmol/L (ref 3.5–5.3)
Sodium: 142 mmol/L (ref 135–146)
TOTAL PROTEIN: 6.3 g/dL (ref 6.1–8.1)

## 2016-11-02 LAB — HEPATITIS C ANTIBODY: HCV Ab: NEGATIVE

## 2016-11-02 LAB — LIPID PANEL
Cholesterol: 165 mg/dL (ref <200)
HDL: 101 mg/dL (ref >50)
LDL CALC: 44 mg/dL (ref <100)
TRIGLYCERIDES: 100 mg/dL (ref <150)
Total CHOL/HDL Ratio: 1.6 Ratio (ref <5.0)
VLDL: 20 mg/dL (ref <30)

## 2016-11-02 LAB — FERRITIN: Ferritin: 6 ng/mL — ABNORMAL LOW (ref 20–288)

## 2016-11-02 LAB — VITAMIN B12: VITAMIN B 12: 213 pg/mL (ref 200–1100)

## 2016-11-02 LAB — VITAMIN D 25 HYDROXY (VIT D DEFICIENCY, FRACTURES): Vit D, 25-Hydroxy: 9 ng/mL — ABNORMAL LOW (ref 30–100)

## 2016-11-08 ENCOUNTER — Other Ambulatory Visit: Payer: Self-pay | Admitting: *Deleted

## 2016-11-08 DIAGNOSIS — D510 Vitamin B12 deficiency anemia due to intrinsic factor deficiency: Secondary | ICD-10-CM

## 2016-11-08 DIAGNOSIS — E559 Vitamin D deficiency, unspecified: Secondary | ICD-10-CM

## 2016-11-14 ENCOUNTER — Ambulatory Visit (INDEPENDENT_AMBULATORY_CARE_PROVIDER_SITE_OTHER): Payer: Medicare Other | Admitting: Family Medicine

## 2016-11-14 ENCOUNTER — Encounter: Payer: Self-pay | Admitting: Family Medicine

## 2016-11-14 VITALS — BP 126/71 | HR 85 | Ht 62.0 in | Wt 139.0 lb

## 2016-11-14 DIAGNOSIS — D510 Vitamin B12 deficiency anemia due to intrinsic factor deficiency: Secondary | ICD-10-CM

## 2016-11-14 DIAGNOSIS — Z0000 Encounter for general adult medical examination without abnormal findings: Secondary | ICD-10-CM

## 2016-11-14 DIAGNOSIS — E559 Vitamin D deficiency, unspecified: Secondary | ICD-10-CM | POA: Diagnosis not present

## 2016-11-14 DIAGNOSIS — Z780 Asymptomatic menopausal state: Secondary | ICD-10-CM

## 2016-11-14 MED ORDER — CYANOCOBALAMIN 1000 MCG/ML IJ SOLN
1000.0000 ug | Freq: Once | INTRAMUSCULAR | Status: AC
  Administered 2016-11-14: 1000 ug via INTRAMUSCULAR

## 2016-11-14 NOTE — Progress Notes (Addendum)
Subjective:   Carrie Shannon is a 71 y.o. female who presents for Medicare Annual (Subsequent) preventive examination.   She complains of intermittant dizziness. Says notices more when she gets up at night to go to th bathroom.  She says she's been very sedentary and has not been active or exercising at all in this past year. She's also been getting more frequent headaches over the last 2 years. She says it will start with the pain at the base of the skull and then come up with a sharp pain between her eyes. He is worried she could have a brain tumor.  She also wanted to go over her blood work that she's had recently. It was noted that she had low iron, low B12, and low vitamin D. She's not currently taking any type of supplementation.  Review of Systems:  Comprehensive ROS is neg except for vomiting and diarrhea. Has f/u with GI next week, Dr. Shary Key.        Objective:     Vitals: BP 126/71   Pulse 85   Ht 5\' 2"  (1.575 m)   Wt 139 lb (63 kg)   SpO2 100%   BMI 25.42 kg/m   Body mass index is 25.42 kg/m.   Physical Exam  Constitutional: She is oriented to person, place, and time. She appears well-developed and well-nourished.  HENT:  Head: Normocephalic and atraumatic.  Right Ear: External ear normal.  Left Ear: External ear normal.  Nose: Nose normal.  Mouth/Throat: Oropharynx is clear and moist.  TMs and canals are clear.   Eyes: Conjunctivae and EOM are normal. Pupils are equal, round, and reactive to light.  Neck: Neck supple. No thyromegaly present.  Cardiovascular: Normal rate, regular rhythm and normal heart sounds.   Pulmonary/Chest: Effort normal and breath sounds normal. She has no wheezes.  Abdominal: Soft. Bowel sounds are normal. She exhibits no distension and no mass. There is tenderness. There is no rebound and no guarding. No hernia.    Lymphadenopathy:    She has no cervical adenopathy.  Neurological: She is alert and oriented to person,  place, and time.  Skin: Skin is warm and dry.  Psychiatric: She has a normal mood and affect.     Tobacco History  Smoking Status  . Never Smoker  Smokeless Tobacco  . Never Used     Counseling given: Not Answered   Past Medical History:  Diagnosis Date  . AC (acromioclavicular) joint bone spurs    heels  . Elbow fracture, right   . Esophageal ring   . Hiatal hernia    EGD 10-27-10/[facility]  . Shoulder fracture, right   . Wrist fracture, right    Past Surgical History:  Procedure Laterality Date  . ABDOMINAL HYSTERECTOMY  1980's    Fibroids  . CHOLECYSTECTOMY  2009  . GASTRIC BYPASS  2003  . TUBAL LIGATION  1975   Family History  Problem Relation Age of Onset  . Cancer Father     esophageal and lung cancer  . Heart attack Mother   . Stroke Mother 64   History  Sexual Activity  . 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 amitriptyline (ELAVIL) 10 MG tablet   . pantoprazole (PROTONIX) 40 MG tablet Take 1 tablet (40  mg total) by mouth 2 (two) times daily.   No facility-administered encounter medications on file as of 11/14/2016.     Activities of Daily Living No flowsheet data found.  Patient Care Team: Hali Marry, MD as PCP - General Lucienne Capers, MD as Referring Physician (Gastroenterology)    Assessment:    Medicare Wellness EXam    Exercise Activities and Dietary recommendations    Goals    None     Fall Risk Fall Risk  11/14/2016 09/16/2016 08/22/2013  Falls in the past year? No Yes Yes  Number falls in past yr: - 2 or more 2 or more  Injury with Fall? - Yes -  Risk Factor Category  - - High Fall Risk  Risk for fall due to : - - Impaired balance/gait   Depression Screen PHQ 2/9 Scores 11/14/2016 08/22/2013  PHQ - 2 Score 0 0       Cognitive Function     6CIT Screen 11/14/2016  What Year? 0 points  What month? 0 points  What time? 0 points  Count back from 20 0 points  Months in reverse 0 points  Repeat phrase 0 points  Total Score 0    Immunization History  Administered Date(s) Administered  . Influenza Whole 07/10/2010  . Influenza, High Dose Seasonal PF 07/11/2013, 08/19/2014  . Influenza-Unspecified 08/18/2014, 08/07/2015, 06/20/2016  . Pneumococcal Conjugate-13 02/19/2014, 08/18/2014, 08/19/2014  . Pneumococcal Polysaccharide-23 10/10/2005, 11/08/2011  . Td 10/11/2003  . Tdap 09/26/2014   Screening Tests Health Maintenance  Topic Date Due  . ZOSTAVAX  06/18/2005  . DEXA SCAN  06/18/2010  . MAMMOGRAM  02/23/2018  . TETANUS/TDAP  09/26/2024  . COLONOSCOPY  09/30/2024  . INFLUENZA VACCINE  Addressed  . Hepatitis C Screening  Completed  . PNA vac Low Risk Adult  Completed      Plan:   During the course of the visit the patient was educated and counseled about the following appropriate screening and preventive services:   Vaccines to include Pneumoccal, Influenza, Hepatitis B, Td, Zostavax, HCV  Colorectal cancer screening  Bone density screening - recommended since none on file.   Diabetes screening - UTD.   Glaucoma screening- encourage yaerly eye exam  Mammography/PAP  Vit D def - start 2000 IU vit D. Recommend bone density.   Iron deficiency anemia-discussed getting on an iron supplement. Warned about potential for constipation may need to use this. And with it. She can also increase intake of iron through her foods. She does like liver.  B12 deficiency-we'll give first B12 shot today. She was interested in trying a shot consider doing a pill. Follow-up in one month for repeat injection with the nurse. We'll recheck levels in 12 weeks.  Patient Instructions (the written plan) was given to the patient.   METHENEY,CATHERINE, MD  11/14/2016

## 2016-11-14 NOTE — Patient Instructions (Addendum)
Recommend tai chi for balance. You can find some graet short videos on Youtube. There are also classes offered here locally in [place]. Recommending he start vitamin D 2000 international units daily. Also recommend start over-the-counter iron and try to increase iron in the diet. Eating liver once a week can be helpful. Schedule a B12 shot once a month.     Bone Densitometry Introduction Bone densitometry is an imaging test that uses a special X-ray to measure the amount of calcium and other minerals in your bones (bone density). This test is also known as a bone mineral density test or dual-energy X-ray absorptiometry (DXA). The test can measure bone density at your hip and your spine. It is similar to having a regular X-ray. You may have this test to:  Diagnose a condition that causes weak or thin bones (osteoporosis).  Predict your risk of a broken bone (fracture).  Determine how well osteoporosis treatment is working. Tell a health care provider about:  Any allergies you have.  All medicines you are taking, including vitamins, herbs, eye drops, creams, and over-the-counter medicines.  Any problems you or family members have had with anesthetic medicines.  Any blood disorders you have.  Any surgeries you have had.  Any medical conditions you have.  Possibility of pregnancy.  Any other medical test you had within the previous 14 days that used contrast material. What are the risks? Generally, this is a safe procedure. However, problems can occur and may include the following:  This test exposes you to a very small amount of radiation.  The risks of radiation exposure may be greater to unborn children. What happens before the procedure?  Do not take any calcium supplements for 24 hours before having the test. You can otherwise eat and drink what you usually do.  Take off all metal jewelry, eyeglasses, dental appliances, and any other metal objects. What happens  during the procedure?  You may lie on an exam table. There will be an X-ray generator below you and an imaging device above you.  Other devices, such as boxes or braces, may be used to position your body properly for the scan.  You will need to lie still while the machine slowly scans your body.  The images will show up on a computer monitor. What happens after the procedure? You may need more testing at a later time. This information is not intended to replace advice given to you by your health care provider. Make sure you discuss any questions you have with your health care provider. Document Released: 10/18/2004 Document Revised: 03/03/2016 Document Reviewed: 03/06/2014  2017 Elsevier  Health Maintenance, Female Introduction Adopting a healthy lifestyle and getting preventive care can go a long way to promote health and wellness. Talk with your health care provider about what schedule of regular examinations is right for you. This is a good chance for you to check in with your provider about disease prevention and staying healthy. In between checkups, there are plenty of things you can do on your own. Experts have done a lot of research about which lifestyle changes and preventive measures are most likely to keep you healthy. Ask your health care provider for more information. Weight and diet Eat a healthy diet  Be sure to include plenty of vegetables, fruits, low-fat dairy products, and lean protein.  Do not eat a lot of foods high in solid fats, added sugars, or salt.  Get regular exercise. This is one of the most important  things you can do for your health.  Most adults should exercise for at least 150 minutes each week. The exercise should increase your heart rate and make you sweat (moderate-intensity exercise).  Most adults should also do strengthening exercises at least twice a week. This is in addition to the moderate-intensity exercise. Maintain a healthy weight  Body  mass index (BMI) is a measurement that can be used to identify possible weight problems. It estimates body fat based on height and weight. Your health care provider can help determine your BMI and help you achieve or maintain a healthy weight.  For females 61 years of age and older:  A BMI below 18.5 is considered underweight.  A BMI of 18.5 to 24.9 is normal.  A BMI of 25 to 29.9 is considered overweight.  A BMI of 30 and above is considered obese. Watch levels of cholesterol and blood lipids  You should start having your blood tested for lipids and cholesterol at 72 years of age, then have this test every 5 years.  You may need to have your cholesterol levels checked more often if:  Your lipid or cholesterol levels are high.  You are older than 72 years of age.  You are at high risk for heart disease. Cancer screening Lung Cancer  Lung cancer screening is recommended for adults 83-51 years old who are at high risk for lung cancer because of a history of smoking.  A yearly low-dose CT scan of the lungs is recommended for people who:  Currently smoke.  Have quit within the past 15 years.  Have at least a 30-pack-year history of smoking. A pack year is smoking an average of one pack of cigarettes a day for 1 year.  Yearly screening should continue until it has been 15 years since you quit.  Yearly screening should stop if you develop a health problem that would prevent you from having lung cancer treatment. Breast Cancer  Practice breast self-awareness. This means understanding how your breasts normally appear and feel.  It also means doing regular breast self-exams. Let your health care provider know about any changes, no matter how small.  If you are in your 20s or 30s, you should have a clinical breast exam (CBE) by a health care provider every 1-3 years as part of a regular health exam.  If you are 18 or older, have a CBE every year. Also consider having a breast  X-ray (mammogram) every year.  If you have a family history of breast cancer, talk to your health care provider about genetic screening.  If you are at high risk for breast cancer, talk to your health care provider about having an MRI and a mammogram every year.  Breast cancer gene (BRCA) assessment is recommended for women who have family members with BRCA-related cancers. BRCA-related cancers include:  Breast.  Ovarian.  Tubal.  Peritoneal cancers.  Results of the assessment will determine the need for genetic counseling and BRCA1 and BRCA2 testing. Cervical Cancer  Your health care provider may recommend that you be screened regularly for cancer of the pelvic organs (ovaries, uterus, and vagina). This screening involves a pelvic examination, including checking for microscopic changes to the surface of your cervix (Pap test). You may be encouraged to have this screening done every 3 years, beginning at age 3.  For women ages 63-65, health care providers may recommend pelvic exams and Pap testing every 3 years, or they may recommend the Pap and pelvic exam, combined  with testing for human papilloma virus (HPV), every 5 years. Some types of HPV increase your risk of cervical cancer. Testing for HPV may also be done on women of any age with unclear Pap test results.  Other health care providers may not recommend any screening for nonpregnant women who are considered low risk for pelvic cancer and who do not have symptoms. Ask your health care provider if a screening pelvic exam is right for you.  If you have had past treatment for cervical cancer or a condition that could lead to cancer, you need Pap tests and screening for cancer for at least 20 years after your treatment. If Pap tests have been discontinued, your risk factors (such as having a new sexual partner) need to be reassessed to determine if screening should resume. Some women have medical problems that increase the chance of  getting cervical cancer. In these cases, your health care provider may recommend more frequent screening and Pap tests. Colorectal Cancer  This type of cancer can be detected and often prevented.  Routine colorectal cancer screening usually begins at 72 years of age and continues through 72 years of age.  Your health care provider may recommend screening at an earlier age if you have risk factors for colon cancer.  Your health care provider may also recommend using home test kits to check for hidden blood in the stool.  A small camera at the end of a tube can be used to examine your colon directly (sigmoidoscopy or colonoscopy). This is done to check for the earliest forms of colorectal cancer.  Routine screening usually begins at age 21.  Direct examination of the colon should be repeated every 5-10 years through 72 years of age. However, you may need to be screened more often if early forms of precancerous polyps or small growths are found. Skin Cancer  Check your skin from head to toe regularly.  Tell your health care provider about any new moles or changes in moles, especially if there is a change in a mole's shape or color.  Also tell your health care provider if you have a mole that is larger than the size of a pencil eraser.  Always use sunscreen. Apply sunscreen liberally and repeatedly throughout the day.  Protect yourself by wearing long sleeves, pants, a wide-brimmed hat, and sunglasses whenever you are outside. Heart disease, diabetes, and high blood pressure  High blood pressure causes heart disease and increases the risk of stroke. High blood pressure is more likely to develop in:  People who have blood pressure in the high end of the normal range (130-139/85-89 mm Hg).  People who are overweight or obese.  People who are African American.  If you are 88-75 years of age, have your blood pressure checked every 3-5 years. If you are 50 years of age or older, have your  blood pressure checked every year. You should have your blood pressure measured twice-once when you are at a hospital or clinic, and once when you are not at a hospital or clinic. Record the average of the two measurements. To check your blood pressure when you are not at a hospital or clinic, you can use:  An automated blood pressure machine at a pharmacy.  A home blood pressure monitor.  If you are between 53 years and 88 years old, ask your health care provider if you should take aspirin to prevent strokes.  Have regular diabetes screenings. This involves taking a blood sample to check  your fasting blood sugar level.  If you are at a normal weight and have a low risk for diabetes, have this test once every three years after 72 years of age.  If you are overweight and have a high risk for diabetes, consider being tested at a younger age or more often. Preventing infection Hepatitis B  If you have a higher risk for hepatitis B, you should be screened for this virus. You are considered at high risk for hepatitis B if:  You were born in a country where hepatitis B is common. Ask your health care provider which countries are considered high risk.  Your parents were born in a high-risk country, and you have not been immunized against hepatitis B (hepatitis B vaccine).  You have HIV or AIDS.  You use needles to inject street drugs.  You live with someone who has hepatitis B.  You have had sex with someone who has hepatitis B.  You get hemodialysis treatment.  You take certain medicines for conditions, including cancer, organ transplantation, and autoimmune conditions. Hepatitis C  Blood testing is recommended for:  Everyone born from [birth year] through 1966.  Anyone with known risk factors for hepatitis C. Sexually transmitted infections (STIs)  You should be screened for sexually transmitted infections (STIs) including gonorrhea and chlamydia if:  You are sexually active and are  younger than 72 years of age.  You are older than 72 years of age and your health care provider tells you that you are at risk for this type of infection.  Your sexual activity has changed since you were last screened and you are at an increased risk for chlamydia or gonorrhea. Ask your health care provider if you are at risk.  If you do not have HIV, but are at risk, it may be recommended that you take a prescription medicine daily to prevent HIV infection. This is called pre-exposure prophylaxis (PrEP). You are considered at risk if:  You are sexually active and do not regularly use condoms or know the HIV status of your partner(s).  You take drugs by injection.  You are sexually active with a partner who has HIV. Talk with your health care provider about whether you are at high risk of being infected with HIV. If you choose to begin PrEP, you should first be tested for HIV. You should then be tested every 3 months for as long as you are taking PrEP. Pregnancy  If you are premenopausal and you may become pregnant, ask your health care provider about preconception counseling.  If you may become pregnant, take 400 to 800 micrograms (mcg) of folic acid every day.  If you want to prevent pregnancy, talk to your health care provider about birth control (contraception). Osteoporosis and menopause  Osteoporosis is a disease in which the bones lose minerals and strength with aging. This can result in serious bone fractures. Your risk for osteoporosis can be identified using a bone density scan.  If you are 27 years of age or older, or if you are at risk for osteoporosis and fractures, ask your health care provider if you should be screened.  Ask your health care provider whether you should take a calcium or vitamin D supplement to lower your risk for osteoporosis.  Menopause may have certain physical symptoms and risks.  Hormone replacement therapy may reduce some of these symptoms and  risks. Talk to your health care provider about whether hormone replacement therapy is right for you. Follow these instructions  at home:  Schedule regular health, dental, and eye exams.  Stay current with your immunizations.  Do not use any tobacco products including cigarettes, chewing tobacco, or electronic cigarettes.  If you are pregnant, do not drink alcohol.  If you are breastfeeding, limit how much and how often you drink alcohol.  Limit alcohol intake to no more than 1 drink per day for nonpregnant women. One drink equals 12 ounces of beer, 5 ounces of wine, or 1 ounces of hard liquor.  Do not use street drugs.  Do not share needles.  Ask your health care provider for help if you need support or information about quitting drugs.  Tell your health care provider if you often feel depressed.  Tell your health care provider if you have ever been abused or do not feel safe at home. This information is not intended to replace advice given to you by your health care provider. Make sure you discuss any questions you have with your health care provider. Document Released: 04/11/2011 Document Revised: 03/03/2016 Document Reviewed: 06/30/2015  2017 Elsevier

## 2016-11-15 ENCOUNTER — Ambulatory Visit (INDEPENDENT_AMBULATORY_CARE_PROVIDER_SITE_OTHER): Payer: Medicare Other

## 2016-11-15 DIAGNOSIS — M810 Age-related osteoporosis without current pathological fracture: Secondary | ICD-10-CM | POA: Diagnosis not present

## 2016-11-15 DIAGNOSIS — M818 Other osteoporosis without current pathological fracture: Secondary | ICD-10-CM | POA: Diagnosis not present

## 2016-11-15 DIAGNOSIS — Z780 Asymptomatic menopausal state: Secondary | ICD-10-CM

## 2016-11-15 MED ORDER — ALENDRONATE SODIUM 70 MG PO TABS: 70.0000 mg | ORAL_TABLET | ORAL | 3 refills | Status: DC

## 2016-11-15 NOTE — Progress Notes (Unsigned)
rx sent for fosamax.   Beatrice Lecher, MD

## 2016-11-17 DIAGNOSIS — R112 Nausea with vomiting, unspecified: Secondary | ICD-10-CM | POA: Diagnosis not present

## 2016-11-17 DIAGNOSIS — R1084 Generalized abdominal pain: Secondary | ICD-10-CM | POA: Diagnosis not present

## 2016-11-17 DIAGNOSIS — R197 Diarrhea, unspecified: Secondary | ICD-10-CM | POA: Diagnosis not present

## 2016-12-12 DIAGNOSIS — D510 Vitamin B12 deficiency anemia due to intrinsic factor deficiency: Secondary | ICD-10-CM | POA: Diagnosis not present

## 2016-12-12 DIAGNOSIS — E559 Vitamin D deficiency, unspecified: Secondary | ICD-10-CM | POA: Diagnosis not present

## 2016-12-12 LAB — RETICULOCYTES
ABS RETIC: 43300 {cells}/uL (ref 20000–80000)
RBC.: 4.33 MIL/uL (ref 3.80–5.10)
RETIC CT PCT: 1 %

## 2016-12-12 LAB — FERRITIN: Ferritin: 25 ng/mL (ref 20–288)

## 2016-12-13 ENCOUNTER — Other Ambulatory Visit: Payer: Self-pay | Admitting: *Deleted

## 2016-12-13 DIAGNOSIS — E559 Vitamin D deficiency, unspecified: Secondary | ICD-10-CM

## 2016-12-13 DIAGNOSIS — D510 Vitamin B12 deficiency anemia due to intrinsic factor deficiency: Secondary | ICD-10-CM

## 2016-12-13 LAB — VITAMIN D 25 HYDROXY (VIT D DEFICIENCY, FRACTURES): VIT D 25 HYDROXY: 16 ng/mL — AB (ref 30–100)

## 2016-12-14 ENCOUNTER — Ambulatory Visit (INDEPENDENT_AMBULATORY_CARE_PROVIDER_SITE_OTHER): Payer: Medicare Other | Admitting: Family Medicine

## 2016-12-14 VITALS — BP 120/81 | HR 66 | Ht 62.0 in | Wt 140.0 lb

## 2016-12-14 DIAGNOSIS — D510 Vitamin B12 deficiency anemia due to intrinsic factor deficiency: Secondary | ICD-10-CM | POA: Diagnosis not present

## 2016-12-14 MED ORDER — CYANOCOBALAMIN 1000 MCG/ML IJ SOLN
1000.0000 ug | Freq: Once | INTRAMUSCULAR | Status: AC
  Administered 2016-12-14: 1000 ug via INTRAMUSCULAR

## 2016-12-14 NOTE — Progress Notes (Signed)
Agree with above.  Maleena Eddleman, MD  

## 2016-12-14 NOTE — Progress Notes (Signed)
   Subjective:    Patient ID: Carrie Shannon, female    DOB: 06/17/1946, 71 y.o.   MRN: [id number]  HPI Pt is here for a vitamin B 12 injection. Denies gastrointestinal problems or dizziness.   Review of Systems  Neurological: Negative for dizziness.       Objective:   Physical Exam        Assessment & Plan:  Pt tolerated injection in RUOQ well without complications. Pt advised to schedule next injection in 30 days.

## 2016-12-21 DIAGNOSIS — R197 Diarrhea, unspecified: Secondary | ICD-10-CM | POA: Diagnosis not present

## 2016-12-21 DIAGNOSIS — K3189 Other diseases of stomach and duodenum: Secondary | ICD-10-CM | POA: Diagnosis not present

## 2016-12-21 DIAGNOSIS — R112 Nausea with vomiting, unspecified: Secondary | ICD-10-CM | POA: Diagnosis not present

## 2016-12-21 HISTORY — PX: COLONOSCOPY WITH ESOPHAGOGASTRODUODENOSCOPY (EGD): SHX5779

## 2016-12-21 LAB — HM COLONOSCOPY

## 2016-12-29 ENCOUNTER — Encounter: Payer: Self-pay | Admitting: Family Medicine

## 2017-01-13 ENCOUNTER — Ambulatory Visit: Payer: Medicare Other

## 2017-01-24 ENCOUNTER — Ambulatory Visit (INDEPENDENT_AMBULATORY_CARE_PROVIDER_SITE_OTHER): Payer: Medicare Other | Admitting: Family Medicine

## 2017-01-24 VITALS — BP 117/60 | HR 76

## 2017-01-24 DIAGNOSIS — D510 Vitamin B12 deficiency anemia due to intrinsic factor deficiency: Secondary | ICD-10-CM | POA: Diagnosis not present

## 2017-01-24 DIAGNOSIS — E538 Deficiency of other specified B group vitamins: Secondary | ICD-10-CM | POA: Diagnosis not present

## 2017-01-24 MED ORDER — CYANOCOBALAMIN 1000 MCG/ML IJ SOLN
1000.0000 ug | Freq: Once | INTRAMUSCULAR | Status: AC
  Administered 2017-01-24: 1000 ug via INTRAMUSCULAR

## 2017-01-24 NOTE — Progress Notes (Signed)
Patient came into clinic today for B12 injection. Pt denies any dizziness, palpitations, chest pain, or any other negative side effects from the B12 injections. Pt tolerated injection in left deltoid well, no immediate complications. Advised to schedule her next injection in four weeks, verbalized understanding. No further questions.

## 2017-01-24 NOTE — Progress Notes (Signed)
Agree with below. \Jarold Macomber, MD  

## 2017-01-29 DIAGNOSIS — M62838 Other muscle spasm: Secondary | ICD-10-CM | POA: Diagnosis not present

## 2017-01-29 DIAGNOSIS — Z9181 History of falling: Secondary | ICD-10-CM | POA: Diagnosis not present

## 2017-01-29 DIAGNOSIS — S299XXA Unspecified injury of thorax, initial encounter: Secondary | ICD-10-CM | POA: Diagnosis not present

## 2017-02-03 DIAGNOSIS — R109 Unspecified abdominal pain: Secondary | ICD-10-CM | POA: Diagnosis not present

## 2017-02-03 DIAGNOSIS — M62838 Other muscle spasm: Secondary | ICD-10-CM | POA: Diagnosis not present

## 2017-02-21 ENCOUNTER — Ambulatory Visit (INDEPENDENT_AMBULATORY_CARE_PROVIDER_SITE_OTHER): Payer: Medicare Other | Admitting: Family Medicine

## 2017-02-21 ENCOUNTER — Other Ambulatory Visit: Payer: Self-pay | Admitting: Family Medicine

## 2017-02-21 ENCOUNTER — Ambulatory Visit: Payer: Medicare Other

## 2017-02-21 VITALS — BP 126/81 | HR 80

## 2017-02-21 DIAGNOSIS — Z1231 Encounter for screening mammogram for malignant neoplasm of breast: Secondary | ICD-10-CM

## 2017-02-21 DIAGNOSIS — D510 Vitamin B12 deficiency anemia due to intrinsic factor deficiency: Secondary | ICD-10-CM

## 2017-02-21 MED ORDER — CYANOCOBALAMIN 1000 MCG/ML IJ SOLN
1000.0000 ug | Freq: Once | INTRAMUSCULAR | Status: AC
  Administered 2017-02-21: 1000 ug via INTRAMUSCULAR

## 2017-02-21 NOTE — Progress Notes (Signed)
Agree with below. Addendum:    I believe that she has limits with moving and including, toileting, bathing, feeding, dressing and grooming. I believe the power wheelchair is needed for pt to be able to perform ADL's in her home

## 2017-02-21 NOTE — Progress Notes (Signed)
Carrie Shannon presents to clinic for her monthly B 12 injection.  Denies palpitation, dizziness, and chest pain. Pt tolerated injection well in the LUOQ without complications. Pt advised to schedule next appointment in four weeks. She verbalized understanding. -EH/RMA

## 2017-03-21 ENCOUNTER — Ambulatory Visit (INDEPENDENT_AMBULATORY_CARE_PROVIDER_SITE_OTHER): Payer: Medicare Other

## 2017-03-21 ENCOUNTER — Ambulatory Visit (INDEPENDENT_AMBULATORY_CARE_PROVIDER_SITE_OTHER): Payer: Medicare Other | Admitting: Family Medicine

## 2017-03-21 VITALS — BP 123/70 | HR 85

## 2017-03-21 DIAGNOSIS — D510 Vitamin B12 deficiency anemia due to intrinsic factor deficiency: Secondary | ICD-10-CM | POA: Diagnosis not present

## 2017-03-21 DIAGNOSIS — Z1231 Encounter for screening mammogram for malignant neoplasm of breast: Secondary | ICD-10-CM

## 2017-03-21 MED ORDER — CYANOCOBALAMIN 1000 MCG/ML IJ SOLN
1000.0000 ug | Freq: Once | INTRAMUSCULAR | Status: AC
  Administered 2017-03-21: 1000 ug via INTRAMUSCULAR

## 2017-03-21 NOTE — Progress Notes (Signed)
Pt was is aware of lab work.  Lab slip printed out and given to pt during visit -EH/RMA

## 2017-03-21 NOTE — Progress Notes (Signed)
Carrie Shannon presents to clinic for her monthly B 12 injection.  Denies palpitation, dizziness, and chest pain. Pt tolerated injection well in the RUOQ without complications. Pt advised to schedule next appointment in four weeks. She verbalized understanding. -EH/RMA

## 2017-03-21 NOTE — Progress Notes (Signed)
Call patient on her notes time to recheck a B12. She can have this drawn the week before she comes in to have her next B12 shot or she can do the day of as long as it's before the next B12 injection. But she has been on supplementation for 6 months at this point and we really need to see where she is at.

## 2017-03-27 DIAGNOSIS — D510 Vitamin B12 deficiency anemia due to intrinsic factor deficiency: Secondary | ICD-10-CM | POA: Diagnosis not present

## 2017-03-27 DIAGNOSIS — E559 Vitamin D deficiency, unspecified: Secondary | ICD-10-CM | POA: Diagnosis not present

## 2017-03-28 ENCOUNTER — Other Ambulatory Visit: Payer: Self-pay | Admitting: *Deleted

## 2017-03-28 LAB — VITAMIN D 25 HYDROXY (VIT D DEFICIENCY, FRACTURES): Vit D, 25-Hydroxy: 12 ng/mL — ABNORMAL LOW (ref 30–100)

## 2017-03-28 LAB — IRON: IRON: 106 ug/dL (ref 45–160)

## 2017-03-29 ENCOUNTER — Other Ambulatory Visit: Payer: Self-pay | Admitting: *Deleted

## 2017-04-17 ENCOUNTER — Ambulatory Visit (INDEPENDENT_AMBULATORY_CARE_PROVIDER_SITE_OTHER): Payer: Medicare Other | Admitting: Family Medicine

## 2017-04-17 VITALS — BP 126/83 | HR 84 | Wt 144.0 lb

## 2017-04-17 DIAGNOSIS — E559 Vitamin D deficiency, unspecified: Secondary | ICD-10-CM

## 2017-04-17 DIAGNOSIS — D510 Vitamin B12 deficiency anemia due to intrinsic factor deficiency: Secondary | ICD-10-CM | POA: Diagnosis not present

## 2017-04-17 MED ORDER — CYANOCOBALAMIN 1000 MCG/ML IJ SOLN
1000.0000 ug | Freq: Once | INTRAMUSCULAR | Status: AC
  Administered 2017-04-17: 1000 ug via INTRAMUSCULAR

## 2017-04-17 NOTE — Progress Notes (Signed)
Pt here for B12 injection. Injection tolerated well, given in LUOQ. Pt to RTC in 4 weeks for next injection.Carrie Shannon

## 2017-04-17 NOTE — Progress Notes (Signed)
Agree with above 

## 2017-05-08 DIAGNOSIS — R112 Nausea with vomiting, unspecified: Secondary | ICD-10-CM | POA: Diagnosis not present

## 2017-05-08 DIAGNOSIS — R159 Full incontinence of feces: Secondary | ICD-10-CM | POA: Diagnosis not present

## 2017-05-08 DIAGNOSIS — Z9884 Bariatric surgery status: Secondary | ICD-10-CM | POA: Diagnosis not present

## 2017-05-08 DIAGNOSIS — K5900 Constipation, unspecified: Secondary | ICD-10-CM | POA: Diagnosis not present

## 2017-06-15 DIAGNOSIS — D510 Vitamin B12 deficiency anemia due to intrinsic factor deficiency: Secondary | ICD-10-CM | POA: Diagnosis not present

## 2017-06-15 DIAGNOSIS — R159 Full incontinence of feces: Secondary | ICD-10-CM | POA: Diagnosis not present

## 2017-06-15 DIAGNOSIS — E559 Vitamin D deficiency, unspecified: Secondary | ICD-10-CM | POA: Diagnosis not present

## 2017-06-15 DIAGNOSIS — K9089 Other intestinal malabsorption: Secondary | ICD-10-CM | POA: Diagnosis not present

## 2017-06-15 DIAGNOSIS — Z1159 Encounter for screening for other viral diseases: Secondary | ICD-10-CM | POA: Diagnosis not present

## 2017-06-15 DIAGNOSIS — K529 Noninfective gastroenteritis and colitis, unspecified: Secondary | ICD-10-CM | POA: Diagnosis not present

## 2017-06-15 DIAGNOSIS — M810 Age-related osteoporosis without current pathological fracture: Secondary | ICD-10-CM | POA: Diagnosis not present

## 2017-07-13 DIAGNOSIS — G8929 Other chronic pain: Secondary | ICD-10-CM | POA: Diagnosis not present

## 2017-07-13 DIAGNOSIS — Z9181 History of falling: Secondary | ICD-10-CM | POA: Diagnosis not present

## 2017-07-13 DIAGNOSIS — R110 Nausea: Secondary | ICD-10-CM | POA: Diagnosis not present

## 2017-07-13 DIAGNOSIS — K289 Gastrojejunal ulcer, unspecified as acute or chronic, without hemorrhage or perforation: Secondary | ICD-10-CM | POA: Diagnosis not present

## 2017-07-13 DIAGNOSIS — R1084 Generalized abdominal pain: Secondary | ICD-10-CM | POA: Diagnosis not present

## 2017-07-13 DIAGNOSIS — R002 Palpitations: Secondary | ICD-10-CM | POA: Diagnosis not present

## 2017-07-13 DIAGNOSIS — I447 Left bundle-branch block, unspecified: Secondary | ICD-10-CM | POA: Diagnosis not present

## 2017-07-13 DIAGNOSIS — R42 Dizziness and giddiness: Secondary | ICD-10-CM | POA: Diagnosis not present

## 2017-07-13 DIAGNOSIS — Z0000 Encounter for general adult medical examination without abnormal findings: Secondary | ICD-10-CM | POA: Diagnosis not present

## 2017-07-13 DIAGNOSIS — R159 Full incontinence of feces: Secondary | ICD-10-CM | POA: Diagnosis not present

## 2017-07-13 DIAGNOSIS — G4709 Other insomnia: Secondary | ICD-10-CM | POA: Diagnosis not present

## 2017-07-13 DIAGNOSIS — K5900 Constipation, unspecified: Secondary | ICD-10-CM | POA: Diagnosis not present

## 2017-07-13 DIAGNOSIS — K219 Gastro-esophageal reflux disease without esophagitis: Secondary | ICD-10-CM | POA: Diagnosis not present

## 2017-07-13 DIAGNOSIS — Z7409 Other reduced mobility: Secondary | ICD-10-CM | POA: Diagnosis not present

## 2017-07-31 DIAGNOSIS — Z23 Encounter for immunization: Secondary | ICD-10-CM | POA: Diagnosis not present

## 2017-08-01 DIAGNOSIS — K6289 Other specified diseases of anus and rectum: Secondary | ICD-10-CM | POA: Diagnosis not present

## 2017-08-01 DIAGNOSIS — R112 Nausea with vomiting, unspecified: Secondary | ICD-10-CM | POA: Diagnosis not present

## 2017-08-01 DIAGNOSIS — K5900 Constipation, unspecified: Secondary | ICD-10-CM | POA: Diagnosis not present

## 2017-08-01 DIAGNOSIS — R159 Full incontinence of feces: Secondary | ICD-10-CM | POA: Diagnosis not present

## 2017-09-07 DIAGNOSIS — R002 Palpitations: Secondary | ICD-10-CM | POA: Diagnosis not present

## 2017-09-07 DIAGNOSIS — I447 Left bundle-branch block, unspecified: Secondary | ICD-10-CM | POA: Diagnosis not present

## 2017-11-21 DIAGNOSIS — R152 Fecal urgency: Secondary | ICD-10-CM | POA: Diagnosis not present

## 2017-11-21 DIAGNOSIS — R112 Nausea with vomiting, unspecified: Secondary | ICD-10-CM | POA: Diagnosis not present

## 2017-11-21 DIAGNOSIS — R159 Full incontinence of feces: Secondary | ICD-10-CM | POA: Diagnosis not present

## 2017-11-21 DIAGNOSIS — K225 Diverticulum of esophagus, acquired: Secondary | ICD-10-CM | POA: Diagnosis not present

## 2017-11-21 DIAGNOSIS — K449 Diaphragmatic hernia without obstruction or gangrene: Secondary | ICD-10-CM | POA: Diagnosis not present

## 2017-11-21 DIAGNOSIS — Z9884 Bariatric surgery status: Secondary | ICD-10-CM | POA: Diagnosis not present

## 2018-01-08 DIAGNOSIS — D510 Vitamin B12 deficiency anemia due to intrinsic factor deficiency: Secondary | ICD-10-CM | POA: Diagnosis not present

## 2018-01-08 DIAGNOSIS — E559 Vitamin D deficiency, unspecified: Secondary | ICD-10-CM | POA: Diagnosis not present

## 2018-01-08 DIAGNOSIS — Z9884 Bariatric surgery status: Secondary | ICD-10-CM | POA: Diagnosis not present

## 2018-01-11 DIAGNOSIS — Z9884 Bariatric surgery status: Secondary | ICD-10-CM | POA: Diagnosis not present

## 2018-01-11 DIAGNOSIS — D510 Vitamin B12 deficiency anemia due to intrinsic factor deficiency: Secondary | ICD-10-CM | POA: Diagnosis not present

## 2018-01-11 DIAGNOSIS — M810 Age-related osteoporosis without current pathological fracture: Secondary | ICD-10-CM | POA: Diagnosis not present

## 2018-01-11 DIAGNOSIS — R42 Dizziness and giddiness: Secondary | ICD-10-CM | POA: Diagnosis not present

## 2018-01-11 DIAGNOSIS — Z7409 Other reduced mobility: Secondary | ICD-10-CM | POA: Diagnosis not present

## 2018-01-11 DIAGNOSIS — E559 Vitamin D deficiency, unspecified: Secondary | ICD-10-CM | POA: Diagnosis not present

## 2018-01-11 DIAGNOSIS — K9049 Malabsorption due to intolerance, not elsewhere classified: Secondary | ICD-10-CM | POA: Diagnosis not present

## 2018-02-28 DIAGNOSIS — R1012 Left upper quadrant pain: Secondary | ICD-10-CM | POA: Diagnosis not present

## 2018-02-28 DIAGNOSIS — R197 Diarrhea, unspecified: Secondary | ICD-10-CM | POA: Diagnosis not present

## 2018-02-28 DIAGNOSIS — R112 Nausea with vomiting, unspecified: Secondary | ICD-10-CM | POA: Diagnosis not present

## 2018-03-12 DIAGNOSIS — B070 Plantar wart: Secondary | ICD-10-CM | POA: Diagnosis not present

## 2018-03-16 DIAGNOSIS — K449 Diaphragmatic hernia without obstruction or gangrene: Secondary | ICD-10-CM | POA: Diagnosis not present

## 2018-03-16 DIAGNOSIS — R112 Nausea with vomiting, unspecified: Secondary | ICD-10-CM | POA: Diagnosis not present

## 2018-03-16 DIAGNOSIS — K257 Chronic gastric ulcer without hemorrhage or perforation: Secondary | ICD-10-CM | POA: Diagnosis not present

## 2018-03-16 DIAGNOSIS — R1013 Epigastric pain: Secondary | ICD-10-CM | POA: Diagnosis not present

## 2018-03-16 HISTORY — PX: ESOPHAGOGASTRODUODENOSCOPY: SHX1529

## 2018-04-09 ENCOUNTER — Encounter: Payer: Self-pay | Admitting: Podiatry

## 2018-04-09 ENCOUNTER — Ambulatory Visit (INDEPENDENT_AMBULATORY_CARE_PROVIDER_SITE_OTHER): Payer: Medicare Other | Admitting: Podiatry

## 2018-04-09 DIAGNOSIS — M21962 Unspecified acquired deformity of left lower leg: Secondary | ICD-10-CM | POA: Diagnosis not present

## 2018-04-09 DIAGNOSIS — M79672 Pain in left foot: Secondary | ICD-10-CM

## 2018-04-09 DIAGNOSIS — Q828 Other specified congenital malformations of skin: Secondary | ICD-10-CM | POA: Diagnosis not present

## 2018-04-09 NOTE — Patient Instructions (Signed)
Seen for painful lesion under left foot. 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 Lesion debrided and pain was relieved. Return as needed.

## 2018-04-09 NOTE — Progress Notes (Signed)
SUBJECTIVE: 71 y.o. year old female presents complaining of painful lesion under 5th MPJ area left foot x 2 month. She was treated with Cryotherapy 2-3 weeks ago and failed to improve.  Review of Systems  Constitutional: Negative.   HENT: Negative.   Eyes: Negative.   Respiratory: Negative.   Cardiovascular: Negative.   Gastrointestinal:       Being treated for Ulcer x years.  Genitourinary: Negative.   Musculoskeletal: Negative.   Skin: Negative.      OBJECTIVE: DERMATOLOGIC EXAMINATION: Thick hard keratotic lesion under 5th MPJ area left foot.   VASCULAR EXAMINATION OF LOWER LIMBS: All pedal pulses are palpable with normal pulsation.  Capillary Filling times within 3 seconds in all digits.  No edema or erythema noted. Temperature gradient from tibial crest to dorsum of foot is within normal bilateral.  NEUROLOGIC EXAMINATION OF THE LOWER LIMBS: All epicritic and tactile sensations grossly intact. Sharp and Dull discriminatory sensations at the plantar ball of hallux is intact bilateral.   MUSCULOSKELETAL EXAMINATION: Positive for hypermobile first ray bilateral.  ASSESSMENT: Porokeratosis sub 5 left. Hypermobile first ray bilateral. Lateral weight shifting bilateral.  PLAN: Reviewed findings and available treatment options. Lesion debrided. Pain relieved. May return as needed.

## 2018-04-11 DIAGNOSIS — Z8379 Family history of other diseases of the digestive system: Secondary | ICD-10-CM | POA: Diagnosis not present

## 2018-04-11 DIAGNOSIS — Z9884 Bariatric surgery status: Secondary | ICD-10-CM | POA: Diagnosis not present

## 2018-04-11 DIAGNOSIS — K289 Gastrojejunal ulcer, unspecified as acute or chronic, without hemorrhage or perforation: Secondary | ICD-10-CM | POA: Diagnosis not present

## 2018-04-11 DIAGNOSIS — K909 Intestinal malabsorption, unspecified: Secondary | ICD-10-CM | POA: Diagnosis not present

## 2018-04-26 DIAGNOSIS — K289 Gastrojejunal ulcer, unspecified as acute or chronic, without hemorrhage or perforation: Secondary | ICD-10-CM | POA: Diagnosis not present

## 2018-04-26 DIAGNOSIS — K449 Diaphragmatic hernia without obstruction or gangrene: Secondary | ICD-10-CM | POA: Diagnosis not present

## 2018-04-26 DIAGNOSIS — Z9884 Bariatric surgery status: Secondary | ICD-10-CM | POA: Diagnosis not present

## 2018-08-07 DIAGNOSIS — Z23 Encounter for immunization: Secondary | ICD-10-CM | POA: Diagnosis not present

## 2018-08-20 ENCOUNTER — Ambulatory Visit (INDEPENDENT_AMBULATORY_CARE_PROVIDER_SITE_OTHER): Payer: Medicare Other | Admitting: Family Medicine

## 2018-08-20 ENCOUNTER — Encounter: Payer: Self-pay | Admitting: Family Medicine

## 2018-08-20 ENCOUNTER — Ambulatory Visit (INDEPENDENT_AMBULATORY_CARE_PROVIDER_SITE_OTHER): Payer: Medicare Other

## 2018-08-20 ENCOUNTER — Other Ambulatory Visit: Payer: Self-pay | Admitting: Family Medicine

## 2018-08-20 ENCOUNTER — Telehealth: Payer: Self-pay | Admitting: Family Medicine

## 2018-08-20 VITALS — BP 118/78 | HR 98 | Ht 59.55 in | Wt 122.0 lb

## 2018-08-20 DIAGNOSIS — R296 Repeated falls: Secondary | ICD-10-CM | POA: Diagnosis not present

## 2018-08-20 DIAGNOSIS — R2681 Unsteadiness on feet: Secondary | ICD-10-CM

## 2018-08-20 DIAGNOSIS — M545 Low back pain, unspecified: Secondary | ICD-10-CM

## 2018-08-20 DIAGNOSIS — R29898 Other symptoms and signs involving the musculoskeletal system: Secondary | ICD-10-CM

## 2018-08-20 DIAGNOSIS — M810 Age-related osteoporosis without current pathological fracture: Secondary | ICD-10-CM | POA: Diagnosis not present

## 2018-08-20 DIAGNOSIS — R42 Dizziness and giddiness: Secondary | ICD-10-CM | POA: Diagnosis not present

## 2018-08-20 DIAGNOSIS — S0990XA Unspecified injury of head, initial encounter: Secondary | ICD-10-CM

## 2018-08-20 DIAGNOSIS — Z1231 Encounter for screening mammogram for malignant neoplasm of breast: Secondary | ICD-10-CM

## 2018-08-20 DIAGNOSIS — Z1239 Encounter for other screening for malignant neoplasm of breast: Secondary | ICD-10-CM

## 2018-08-20 NOTE — Telephone Encounter (Signed)
Lab order placed per Radiology protocol.

## 2018-08-20 NOTE — Progress Notes (Signed)
Subjective:    CC: Carrie Shannon falls.  72 year old female comes in today to report several concerns.  She says she actually fell back in either January February earlier this year and hit her head and says she passed out.  She does not know how long she was actually unconscious.  She is just had gait instability for about a year where she noticed she is wobbles and easily falls.  She reports she is had multiple falls.  She is really difficult at night or when it is dark.  About a month ago her husband's been noticed that she was walking and started dragging her left leg and drawing her right arm inward to her body.  Unclear how long this last.  She says she was not really aware of it but her husband noted it and asked her to get evaluated for possible TIA.  She reports that she stays dizzy.  Denies any ear pain or pressure.  This is been going on for about a year.  She also reports low back pain and persistan weakness in her left leg.  He notices that she sometimes drags the foot and sometimes will stumble because of it.  Back pain is mild and comes and goes.  She denies any recent injury that she thinks may have caused it.   Past medical history, Surgical history, Family history not pertinant except as noted below, Social history, Allergies, and medications have been entered into the medical record, reviewed, and corrections made.   Review of Systems: No fevers, chills, night sweats, weight loss, chest pain, or shortness of breath.   Objective:    General: Well Developed, well nourished, and in no acute distress.  Neuro: Alert and oriented x3, extra-ocular muscles intact, sensation grossly intact.  HEENT: Normocephalic, atraumatic, oropharynx clear, TMs and canals are clear bilaterally no significant cervical lymphadenopathy. Skin: Warm and dry, no rashes. Cardiac: Regular rate and rhythm, no murmurs rubs or gallops, no lower extremity edema.  Respiratory: Clear to auscultation bilaterally. Not using  accessory muscles, speaking in full sentences. Neuro: No nerves II through XII are intact.  Finger-to-nose is normal.  Heel-to-shin is normal.  She did start to waver her balance with the Romberg.  She definitely has gait instability with just walking forward.  He has significant weakness with left hip flexion compared to the right.  She has slight decreased weakness with left knee extension and flexion.  She actually has pretty good left foot dorsiflexion and plantarflexion.   Impression and Recommendations:    Gait instability-unclear etiology at this point I would like to get a brain MRI to take a look at the cerebellum if we can and better evaluate if she may have any sign of stroke lesions or hydrocephalus.  Dizziness-see note above.  Low back pain/left leg weakness-unclear etiology.  Will start with brain MRI.  We will also start with lumbar spine film.  Since she does get some occasional pain directly over that lumbar spine.  Consider MRI for further work-up if nothing on the brain MRI. Consider spinal lesion, versus MS, versus stroke.    Recurrent falls-once we figure out the etiology of her gait instability then she might actually benefit from formal physical therapy.

## 2018-08-21 DIAGNOSIS — R42 Dizziness and giddiness: Secondary | ICD-10-CM | POA: Diagnosis not present

## 2018-08-21 DIAGNOSIS — R2681 Unsteadiness on feet: Secondary | ICD-10-CM | POA: Diagnosis not present

## 2018-08-21 DIAGNOSIS — S0990XA Unspecified injury of head, initial encounter: Secondary | ICD-10-CM | POA: Diagnosis not present

## 2018-08-21 LAB — COMPREHENSIVE METABOLIC PANEL
AG Ratio: 1.2 (calc) (ref 1.0–2.5)
ALBUMIN MSPROF: 2.9 g/dL — AB (ref 3.6–5.1)
ALKALINE PHOSPHATASE (APISO): 139 U/L — AB (ref 33–130)
ALT: 21 U/L (ref 6–29)
AST: 20 U/L (ref 10–35)
BUN: 11 mg/dL (ref 7–25)
CO2: 29 mmol/L (ref 20–32)
CREATININE: 0.61 mg/dL (ref 0.60–0.93)
Calcium: 8.3 mg/dL — ABNORMAL LOW (ref 8.6–10.4)
Chloride: 107 mmol/L (ref 98–110)
Globulin: 2.4 g/dL (calc) (ref 1.9–3.7)
Glucose, Bld: 77 mg/dL (ref 65–99)
POTASSIUM: 3.5 mmol/L (ref 3.5–5.3)
Sodium: 144 mmol/L (ref 135–146)
Total Bilirubin: 0.6 mg/dL (ref 0.2–1.2)
Total Protein: 5.3 g/dL — ABNORMAL LOW (ref 6.1–8.1)

## 2018-08-22 ENCOUNTER — Other Ambulatory Visit: Payer: Self-pay | Admitting: Family Medicine

## 2018-08-22 ENCOUNTER — Other Ambulatory Visit: Payer: Self-pay | Admitting: *Deleted

## 2018-08-22 DIAGNOSIS — R2681 Unsteadiness on feet: Secondary | ICD-10-CM

## 2018-08-22 DIAGNOSIS — R42 Dizziness and giddiness: Secondary | ICD-10-CM

## 2018-08-22 MED ORDER — LORAZEPAM 0.5 MG PO TABS: 0.5000 mg | ORAL_TABLET | Freq: Once | ORAL | 0 refills | Status: AC

## 2018-08-22 NOTE — Progress Notes (Unsigned)
Pt need med sent for mild sedation before her MRI. Rx sent for ativan. Let pt know this was done.

## 2018-08-23 DIAGNOSIS — R2681 Unsteadiness on feet: Secondary | ICD-10-CM | POA: Diagnosis not present

## 2018-08-23 DIAGNOSIS — R42 Dizziness and giddiness: Secondary | ICD-10-CM | POA: Diagnosis not present

## 2018-08-23 NOTE — Progress Notes (Signed)
Pt advised.

## 2018-08-24 ENCOUNTER — Ambulatory Visit: Payer: Medicare Other

## 2018-08-24 LAB — URINALYSIS, MICROSCOPIC ONLY
BACTERIA UA: NONE SEEN /HPF
HYALINE CAST: NONE SEEN /LPF

## 2018-08-24 LAB — MICROALBUMIN, URINE: MICROALB UR: 0.6 mg/dL

## 2018-08-25 ENCOUNTER — Ambulatory Visit (HOSPITAL_BASED_OUTPATIENT_CLINIC_OR_DEPARTMENT_OTHER)
Admission: RE | Admit: 2018-08-25 | Discharge: 2018-08-25 | Disposition: A | Discharge status: Home / Self Care | Payer: Medicare Other | Source: Ambulatory Visit | Attending: Family Medicine | Admitting: Family Medicine

## 2018-08-25 ENCOUNTER — Encounter (HOSPITAL_BASED_OUTPATIENT_CLINIC_OR_DEPARTMENT_OTHER): Payer: Self-pay

## 2018-08-25 DIAGNOSIS — X58XXXA Exposure to other specified factors, initial encounter: Secondary | ICD-10-CM | POA: Diagnosis not present

## 2018-08-25 DIAGNOSIS — Z1231 Encounter for screening mammogram for malignant neoplasm of breast: Secondary | ICD-10-CM | POA: Diagnosis not present

## 2018-08-25 DIAGNOSIS — R2681 Unsteadiness on feet: Secondary | ICD-10-CM | POA: Insufficient documentation

## 2018-08-25 DIAGNOSIS — R410 Disorientation, unspecified: Secondary | ICD-10-CM | POA: Diagnosis not present

## 2018-08-25 DIAGNOSIS — I6782 Cerebral ischemia: Secondary | ICD-10-CM | POA: Diagnosis not present

## 2018-08-25 DIAGNOSIS — R42 Dizziness and giddiness: Secondary | ICD-10-CM | POA: Diagnosis not present

## 2018-08-25 DIAGNOSIS — S0990XA Unspecified injury of head, initial encounter: Secondary | ICD-10-CM | POA: Diagnosis not present

## 2018-08-25 MED ORDER — GADOBENATE DIMEGLUMINE 529 MG/ML IV SOLN
10.0000 mL | Freq: Once | INTRAVENOUS | Status: AC | PRN
  Administered 2018-08-25: 10 mL via INTRAVENOUS

## 2018-08-27 ENCOUNTER — Other Ambulatory Visit: Payer: Self-pay | Admitting: *Deleted

## 2018-08-27 DIAGNOSIS — R51 Headache: Secondary | ICD-10-CM

## 2018-08-27 DIAGNOSIS — S0990XA Unspecified injury of head, initial encounter: Secondary | ICD-10-CM

## 2018-08-27 DIAGNOSIS — R2681 Unsteadiness on feet: Secondary | ICD-10-CM

## 2018-08-27 DIAGNOSIS — R519 Headache, unspecified: Secondary | ICD-10-CM

## 2018-08-27 DIAGNOSIS — R42 Dizziness and giddiness: Secondary | ICD-10-CM

## 2018-09-02 DIAGNOSIS — Z9884 Bariatric surgery status: Secondary | ICD-10-CM | POA: Diagnosis not present

## 2018-09-02 DIAGNOSIS — K219 Gastro-esophageal reflux disease without esophagitis: Secondary | ICD-10-CM | POA: Diagnosis not present

## 2018-09-02 DIAGNOSIS — K838 Other specified diseases of biliary tract: Secondary | ICD-10-CM | POA: Diagnosis not present

## 2018-09-02 DIAGNOSIS — N390 Urinary tract infection, site not specified: Secondary | ICD-10-CM | POA: Diagnosis not present

## 2018-09-02 DIAGNOSIS — S299XXA Unspecified injury of thorax, initial encounter: Secondary | ICD-10-CM | POA: Diagnosis not present

## 2018-09-02 DIAGNOSIS — S3993XA Unspecified injury of pelvis, initial encounter: Secondary | ICD-10-CM | POA: Diagnosis not present

## 2018-09-02 DIAGNOSIS — K5730 Diverticulosis of large intestine without perforation or abscess without bleeding: Secondary | ICD-10-CM | POA: Diagnosis not present

## 2018-09-02 DIAGNOSIS — R1013 Epigastric pain: Secondary | ICD-10-CM | POA: Diagnosis not present

## 2018-09-02 DIAGNOSIS — S3991XA Unspecified injury of abdomen, initial encounter: Secondary | ICD-10-CM | POA: Diagnosis not present

## 2018-09-02 DIAGNOSIS — E860 Dehydration: Secondary | ICD-10-CM | POA: Diagnosis not present

## 2018-09-10 DIAGNOSIS — R1084 Generalized abdominal pain: Secondary | ICD-10-CM | POA: Diagnosis not present

## 2018-09-10 DIAGNOSIS — K279 Peptic ulcer, site unspecified, unspecified as acute or chronic, without hemorrhage or perforation: Secondary | ICD-10-CM | POA: Diagnosis not present

## 2018-09-10 DIAGNOSIS — R112 Nausea with vomiting, unspecified: Secondary | ICD-10-CM | POA: Diagnosis not present

## 2018-09-10 DIAGNOSIS — R1012 Left upper quadrant pain: Secondary | ICD-10-CM | POA: Diagnosis not present

## 2018-09-13 ENCOUNTER — Ambulatory Visit: Payer: Medicare Other | Admitting: Family Medicine

## 2018-09-20 ENCOUNTER — Encounter: Payer: Self-pay | Admitting: Family Medicine

## 2018-09-20 ENCOUNTER — Ambulatory Visit (INDEPENDENT_AMBULATORY_CARE_PROVIDER_SITE_OTHER): Payer: Medicare Other | Admitting: Family Medicine

## 2018-09-20 VITALS — BP 118/78 | Temp 97.8°F | Ht 59.55 in | Wt 114.0 lb

## 2018-09-20 DIAGNOSIS — M21372 Foot drop, left foot: Secondary | ICD-10-CM | POA: Diagnosis not present

## 2018-09-20 DIAGNOSIS — K257 Chronic gastric ulcer without hemorrhage or perforation: Secondary | ICD-10-CM | POA: Insufficient documentation

## 2018-09-20 DIAGNOSIS — R634 Abnormal weight loss: Secondary | ICD-10-CM | POA: Insufficient documentation

## 2018-09-20 DIAGNOSIS — R29898 Other symptoms and signs involving the musculoskeletal system: Secondary | ICD-10-CM

## 2018-09-20 DIAGNOSIS — I447 Left bundle-branch block, unspecified: Secondary | ICD-10-CM

## 2018-09-20 DIAGNOSIS — R011 Cardiac murmur, unspecified: Secondary | ICD-10-CM | POA: Diagnosis not present

## 2018-09-20 DIAGNOSIS — Z1321 Encounter for screening for nutritional disorder: Secondary | ICD-10-CM | POA: Diagnosis not present

## 2018-09-20 DIAGNOSIS — R55 Syncope and collapse: Secondary | ICD-10-CM | POA: Diagnosis not present

## 2018-09-20 DIAGNOSIS — R7989 Other specified abnormal findings of blood chemistry: Secondary | ICD-10-CM | POA: Diagnosis not present

## 2018-09-20 DIAGNOSIS — M6281 Muscle weakness (generalized): Secondary | ICD-10-CM | POA: Diagnosis not present

## 2018-09-20 NOTE — Assessment & Plan Note (Addendum)
Unclear etiology at this point.  She does have a heart murmur that has not been worked up so we will schedule her for echocardiogram.  EKG today did show rate of 92 bpm, normal sinus rhythm with left bundle branch block.  I will see if we have an old one for comparison.  Blood pressure is stable today.  Not sure if she may also been dehydrated as she was actually recently seen in the emergency department for abdominal pain and dehydration as well as a UTI. Orthostatics were normal today.

## 2018-09-20 NOTE — Assessment & Plan Note (Signed)
For for echocardiogram for further work-up.  Also consider this could be related to the syncopal event.

## 2018-09-20 NOTE — Progress Notes (Addendum)
Established Patient Office Visit  Subjective:  Patient ID: Carrie Shannon, female    DOB: Aug 27, 1946  Age: 72 y.o. MRN: [id number]  CC:  Chief Complaint  Patient presents with  . Follow-up    HPI Carrie Shannon presents for synope.  She has 2 episodes on Monday where she passed out. First one is right after she got out of bed.  She was walking to her dresser and passed out or fainted as her husband reports.  He said it was really just a few seconds and then she was calling his name.  A couple hours later she was in the kitchen and the same thing happened.  Again she was out for maybe just a couple of seconds he said it was not even a full minute.  She was not having any chest pain or shortness of breath.  She thinks she may have hit her head on the second fall but says it really did not bother her and it was not painful.  It has not happened again since then.  Some gait instability which we had discussed at her previous appointment 4 weeks ago.  We had actually ordered an MRI of her brain November 16 which just showed some mild chronic small vessel ischemic disease but no other concerning findings. She is not moving her right arm as well as her left and she has been dragging her left foot.     He does continue to have some left low back pain but no cervical pain.  We did do a lumbar x-ray showing some degenerative disc disease and facet disease of the lumbar spine.  Also had an endplate compression deformity at T10 of the vertebral body.  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  She has been followed by Dr Shary [facility] for quite some time.  She is been on medication for quite some time and still does not completely control her symptoms.  He had a consultation with the bariatric surgeon Dr. Toney Rakes about the anastomotic ulcer.  She was more recently started on ranitidine and misopristol.  She was actually on both of those medications  for about a month and stopped them pleated the prescription about 2 weeks ago.  Husband was concerned about the left arm weakness and foot dragging that started about 5 weeks ago while she was on these medications.  He did not know if it was potentially a side effect.  Went to the emergency department at [place] recently for abdominal pain.  She did have a CT of chest, abdomen, and pelvis which showed a compression deformity at T10 and some sclerosis of the vertebral body.  Findings could be due to an old fracture but there is a possibility of blastic metastasis is considered less likely but not excluded they did recommend considering a nuclear medicine bone scan or MRI if it may be helpful.  There was a little bit of fatty infiltration of the liver but no focal lesions.  She is had a cholecystectomy.  Pancreas and spleen and adrenal glands looks normal.   CT Chest Abdomen Pelvis W IV Contrast11/24/2019 [facility] Result Impression  IMPRESSION: There is no acute traumatic injury of the chest, abdomen or pelvis.  There is a compression deformity of T10. There is sclerosis of the vertebral body. Findings could be due to an old fracture. Possibility of blastic metastasis considered less likely not excluded. Clinical correlation suggested. If indicated,  nuclear  medicine bone scan or MRI may be helpful.  Electronically Signed by: Virl Cagey  Result Narrative  CT CHEST, ABDOMEN, AND PELVIS WITH IV CONTRAST   COMPARISON: None  INDICATION: Polytrauma, critical, chest-abd-pelvis inj suspected  TECHNIQUE: CT imaging was performed of the chest, abdomen, and pelvis following the uncomplicated intravenous administration of 80 mL of Iso-Vue 370 iodinated contrast. Radiation dose reduction was utilized (automated exposure control, mA or kV  adjustment based on patient size, or iterative image reconstruction). Image post processing was performed creating multi planar 2D and angiographic 3D  MIP, shaded surface rendering, or 3D volume rendering images for review and comparison. Coronal and  sagittal images were also generated and reviewed.  FINDINGS:   CHEST:   - Lower Neck: Unremarkable.  - Heart and Vessels: Normal heart size. No pericardial effusion. Normal caliber thoracic aorta and main pulmonary artery.  - Mediastinum, Hila and Axilla: No lymphadenopathy or masses.  - Lungs: Central airways are patent. No nodules or other abnormal opacities.   - Pleura: No pleural effusion or pneumothorax.  - Miscellaneous: N/A  ABDOMEN AND PELVIS:  - Liver: There is diffuse fatty infiltration of the liver. There is no laceration. No focal lesions. Patent hepatic veins and portal veins.  - Biliary Tree and Gallbladder: There is been a cholecystectomy. Common bile duct is dilated. No choledocholithiasis is seen.  - Pancreas: Normal.  - Spleen: Normal.   - Adrenal Glands: Normal.  - Kidneys and Bladder: No hydronephrosis. No focal renal lesions.  - Gastrointestinal Tract: There is been gastric surgery. There is no bowel obstruction. There is no bowel wall thickening. There are scattered diverticula of the colon. The appendix is normal.  - Peritoneal Cavity and Retroperitoneum: No free fluid. No free intraperitoneal air.  - Lymph Nodes: No retroperitoneal, mesenteric or pelvic lymphadenopathy.   - Vasculature: Normal caliber abdominal aorta. Patent proximal aortic branch vessels.  - Reproductive: There has been a hysterectomy.  - Miscellaneous: N/A  MUSCULOSKELETAL:   There is a compression deformity of T10. There is sclerosis of the vertebral body. Findings could be due to an old fracture. Possibility of blastic metastasis considered less likely not excluded. Clinical correlation suggested. If indicated, nuclear  medicine bone scan or MRI may be helpful.  Other Result Information  Acute Interface, Incoming Rad Results - 09/02/2018 11:36 PM EST CT CHEST, ABDOMEN,  AND PELVIS WITH IV CONTRAST   COMPARISON:  None  INDICATION:  Polytrauma, critical, chest-abd-pelvis inj suspected  TECHNIQUE:  CT imaging was performed of the chest, abdomen, and pelvis following the uncomplicated intravenous administration of 80 mL of Iso-Vue 370 iodinated contrast. Radiation dose reduction was utilized (automated exposure control, mA or kV  adjustment based on patient size, or iterative image reconstruction). Image post processing was performed creating multi planar 2D and angiographic 3D MIP, shaded surface rendering, or 3D volume rendering images for review and comparison. Coronal and  sagittal images were also generated and reviewed.  FINDINGS:   CHEST:   - Lower Neck: Unremarkable.  - Heart and Vessels: Normal heart size.  No pericardial effusion. Normal caliber thoracic aorta and main pulmonary artery.  - Mediastinum, Hila and Axilla: No lymphadenopathy or masses.  - Lungs: Central airways are patent. No nodules or other abnormal opacities.   - Pleura: No pleural effusion or pneumothorax.  - Miscellaneous: N/A  ABDOMEN AND PELVIS:  - Liver: There is diffuse fatty infiltration of the liver. There is no laceration. No focal lesions.  Patent hepatic  veins and portal veins.  - Biliary Tree and Gallbladder: There is been a cholecystectomy. Common bile duct is dilated. No choledocholithiasis is seen.  - Pancreas: Normal.  - Spleen: Normal.   - Adrenal Glands: Normal.  - Kidneys and Bladder: No hydronephrosis. No focal renal lesions.  - Gastrointestinal Tract: There is been gastric surgery. There is no bowel obstruction. There is no bowel wall thickening. There are scattered diverticula of the colon. The appendix is normal.  - Peritoneal Cavity and Retroperitoneum: No free fluid.  No free intraperitoneal air.  - Lymph Nodes: No retroperitoneal, mesenteric or pelvic lymphadenopathy.   - Vasculature: Normal caliber abdominal aorta. Patent proximal aortic  branch vessels.  - Reproductive: There has been a hysterectomy.  - Miscellaneous: N/A  MUSCULOSKELETAL:   There is a compression deformity of T10. There is sclerosis of the vertebral body. Findings could be due to an old fracture. Possibility of blastic metastasis considered less likely not excluded. Clinical correlation suggested. If indicated, nuclear  medicine bone scan or MRI may be helpful.   IMPRESSION: There is no acute traumatic injury of the chest, abdomen or pelvis.  There is a compression deformity of T10. There is sclerosis of the vertebral body. Findings could be due to an old fracture. Possibility of blastic metastasis considered less likely not excluded. Clinical correlation suggested. If indicated, nuclear  medicine bone scan or MRI may be helpful.  Electronically Signed by: Virl Cagey     Past Medical History:  Diagnosis Date  . AC (acromioclavicular) joint bone spurs    heels  . Elbow fracture, right   . Esophageal ring   . Hiatal hernia    EGD 10-27-10/[facility]  . Shoulder fracture, right   . Wrist fracture, right     Past Surgical History:  Procedure Laterality Date  . ABDOMINAL HYSTERECTOMY  1980's    Fibroids  . CHOLECYSTECTOMY  2009  . GASTRIC BYPASS  2003  . TUBAL LIGATION  1975    Family History  Problem Relation Age of Onset  . Cancer Father        esophageal and lung cancer  . Heart attack Mother   . Stroke Mother 51    Social History   Socioeconomic History  . Marital status: Married    Spouse name: Not on file  . Number of children: Not on file  . Years of education: Not on file  . Highest education level: Not on file  Occupational History  . Not on file  Social Needs  . Financial resource strain: Not on file  . Food insecurity:    Worry: Not on file    Inability: Not on file  . Transportation needs:    Medical: Not on file    Non-medical: Not on file  Tobacco Use  . Smoking status: Never  Smoker  . Smokeless tobacco: Never Used  Substance and Sexual Activity  . Alcohol use: No  . Drug use: No  . 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 Physical activity:    Days per week: Not on file    Minutes per session: Not on file  . Stress: Not on file  Relationships  . Social connections:    Talks on phone: Not on file    Gets together: Not on file    Attends religious service: Not on file  Active member of club or organization: Not on file    Attends meetings of clubs or organizations: Not on file    Relationship status: Not on file  . Intimate partner violence:    Fear of current or ex partner: Not on file    Emotionally abused: Not on file    Physically abused: Not on file    Forced sexual activity: Not on file  Other Topics Concern  . Not on file  Social History Narrative  . Not on file    Outpatient Medications Prior to Visit  Medication Sig Dispense Refill  . amitriptyline (ELAVIL) 25 MG tablet Take 25 mg by mouth at bedtime.    . cholecalciferol (VITAMIN D) 1000 units tablet Take 2,000 Units by mouth daily.    . colestipol (COLESTID) 1 g tablet Take 1 g by mouth 2 (two) times daily.    . Cyanocobalamin (B-12) 1000 MCG/ML KIT Inject 1,000 mg into the muscle every 30 (thirty) days.    . ondansetron (ZOFRAN) 4 MG tablet Take 4 mg by mouth every 8 (eight) hours as needed.  1  . pantoprazole (PROTONIX) 40 MG tablet Take 1 tablet (40 mg total) by mouth 2 (two) times daily. 60 tablet 1  . sucralfate (CARAFATE) 1 g tablet Take 1 g by mouth.  2  . vitamin B-12 (CYANOCOBALAMIN) 250 MCG tablet Take 250 mcg by mouth to Post Anesthesia Care Unit.     No facility-administered medications prior to visit.     Allergies  Allergen Reactions  . Doxepin Other (See Comments)    Headaches  Other reaction(s): Other Headaches Headaches   . Other  Rash  . Tape Rash    Band aids    ROS Review of Systems    Objective:    Physical Exam  Constitutional: She is oriented to person, place, and time. She appears well-developed and well-nourished.  Is very pale on exam.  HENT:  Head: Normocephalic and atraumatic.  Nose: Nose normal.  Eyes: Conjunctivae are normal.  Cardiovascular: Normal rate and regular rhythm.  Murmur heard. Is a very short, and coarse S1 heart sound.  Pulmonary/Chest: Effort normal and breath sounds normal.  Neurological: She is alert and oriented to person, place, and time.  Normal cervical flexion.  Decreased extension.  Normal rotation right and left.  Normal side bending.  She has some weakness in her left shoulder compared to the right though she is able to lift it to almost 180 degrees she just had more difficulty lifting it.  Nontender over the shoulder joint.  She has some weakness with left hip flexion and left foot dorsiflexion.  Knee strength is 5 out of 5 with flexion and extension.  Right knee hip and ankle are 5 out of 5 in regards to strength.  Skin: Skin is warm and dry. No rash noted.  Psychiatric: She has a normal mood and affect. Her behavior is normal.    BP 118/78   Temp 97.8 F (36.6 C) (Oral)   Ht 4' 11.55" (1.513 m)   Wt 114 lb (51.7 kg)   SpO2 100%   BMI 22.60 kg/m  Wt Readings from Last 3 Encounters:  09/20/18 114 lb (51.7 kg)  08/20/18 122 lb (55.3 kg)  04/17/17 144 lb (65.3 kg)     There are no preventive care reminders to display for this patient.  There are no preventive care reminders to display for this patient.  Lab Results  Component Value Date  TSH 1.022 10/21/2010   Lab Results  Component Value Date   WBC 5.5 11/01/2016   HGB 11.3 (L) 11/01/2016   HCT 36.4 11/01/2016   MCV 88.6 11/01/2016   PLT 354 11/01/2016   Lab Results  Component Value Date   NA 144 08/21/2018   K 3.5 08/21/2018   CO2 29 08/21/2018   GLUCOSE 77 08/21/2018   BUN 11 08/21/2018    CREATININE 0.61 08/21/2018   BILITOT 0.6 08/21/2018   ALKPHOS 81 11/01/2016   AST 20 08/21/2018   ALT 21 08/21/2018   PROT 5.3 (L) 08/21/2018   ALBUMIN 3.8 11/01/2016   CALCIUM 8.3 (L) 08/21/2018   Lab Results  Component Value Date   CHOL 165 11/01/2016   Lab Results  Component Value Date   HDL 101 11/01/2016   Lab Results  Component Value Date   LDLCALC 44 11/01/2016   Lab Results  Component Value Date   TRIG 100 11/01/2016   Lab Results  Component Value Date   CHOLHDL 1.6 11/01/2016   No results found for: HGBA1C    Assessment & Plan:   Problem List Items Addressed This Visit      Cardiovascular and Mediastinum   Syncope    Unclear etiology at this point.  She does have a heart murmur that has not been worked up so we will schedule her for echocardiogram.  EKG today did show rate of 92 bpm, normal sinus rhythm with left bundle branch block.  I will see if we have an old one for comparison.  Blood pressure is stable today.  Not sure if she may also been dehydrated as she was actually recently seen in the emergency department for abdominal pain and dehydration as well as a UTI. Orthostatics were normal today.      Relevant Orders   ECHOCARDIOGRAM COMPLETE   COMPLETE METABOLIC PANEL WITH GFR   CBC with Differential/Platelet   IBC panel   TSH   B12 and Folate Panel   Sedimentation rate   Fe+TIBC+Fer     Digestive   Chronic gastric ulcer    Anastomic ulcers.  See scanned copy of pictures from digestive health.  Last EGD was done March 16, 2018.  See if we can get another opinion about the gastric ulcers.  She is been on medication for quite some time and still does not completely control her symptoms.  She was more recently started on ranitidine and misopristol.  He was actually on both of those medications for about a month and stopped them pleated the prescription about 2 weeks ago.        Other   Left foot drop - Primary    X-ray about 4 weeks ago the lumbar  spine just showed some degenerative disc and arthritis.  Like to move forward with an MRI to see if she may have some spinal cord compression.      Relevant Orders   MR Lumbar Spine Wo Contrast   COMPLETE METABOLIC PANEL WITH GFR   CBC with Differential/Platelet   IBC panel   TSH   B12 and Folate Panel   Sedimentation rate   Fe+TIBC+Fer   Left arm weakness   Relevant Orders   COMPLETE METABOLIC PANEL WITH GFR   CBC with Differential/Platelet   IBC panel   TSH   B12 and Folate Panel   Sedimentation rate   Fe+TIBC+Fer   Heart murmur    For for echocardiogram for further work-up.  Also consider this  could be related to the syncopal event.      Relevant Orders   ECHOCARDIOGRAM COMPLETE   COMPLETE METABOLIC PANEL WITH GFR   CBC with Differential/Platelet   IBC panel   TSH   B12 and Folate Panel   Sedimentation rate   Fe+TIBC+Fer   Abnormal weight loss    Continues to lose weight but has significantly decreased appetite.  Likely secondary to chronic ulcers.  Will try to address with GI.  We will try to get in touch with Dr. Bryan Lemma to see if he may be helpful for her.  If not then consider referral to [facility].      Relevant Orders   COMPLETE METABOLIC PANEL WITH GFR   CBC with Differential/Platelet   IBC panel   TSH   B12 and Folate Panel   Sedimentation rate   Fe+TIBC+Fer    Other Visit Diagnoses    Muscle weakness (generalized)       Relevant Orders   MR Lumbar Spine Wo Contrast   COMPLETE METABOLIC PANEL WITH GFR   CBC with Differential/Platelet   IBC panel   TSH   B12 and Folate Panel   Sedimentation rate   Fe+TIBC+Fer       No orders of the defined types were placed in this encounter.   Follow-up: Return in about 2 weeks (around 10/04/2018) for recheck .    Beatrice Lecher, MD

## 2018-09-20 NOTE — Assessment & Plan Note (Signed)
X-ray about 4 weeks ago the lumbar spine just showed some degenerative disc and arthritis.  Like to move forward with an MRI to see if she may have some spinal cord compression.

## 2018-09-20 NOTE — Assessment & Plan Note (Addendum)
Anastomic ulcers.  See scanned copy of pictures from digestive health.  Last EGD was done March 16, 2018.  See if we can get another opinion about the gastric ulcers.  She is been on medication for quite some time and still does not completely control her symptoms.  She was more recently started on ranitidine and misopristol.  He was actually on both of those medications for about a month and stopped them pleated the prescription about 2 weeks ago.

## 2018-09-20 NOTE — Assessment & Plan Note (Signed)
Continues to lose weight but has significantly decreased appetite.  Likely secondary to chronic ulcers.  Will try to address with GI.  We will try to get in touch with Dr. Bryan Lemma to see if he may be helpful for her.  If not then consider referral to [facility].

## 2018-09-21 LAB — COMPLETE METABOLIC PANEL WITH GFR
AG Ratio: 1.1 (calc) (ref 1.0–2.5)
ALKALINE PHOSPHATASE (APISO): 124 U/L (ref 33–130)
ALT: 16 U/L (ref 6–29)
AST: 19 U/L (ref 10–35)
Albumin: 2.7 g/dL — ABNORMAL LOW (ref 3.6–5.1)
BILIRUBIN TOTAL: 0.4 mg/dL (ref 0.2–1.2)
BUN: 12 mg/dL (ref 7–25)
CHLORIDE: 107 mmol/L (ref 98–110)
CO2: 32 mmol/L (ref 20–32)
Calcium: 8.1 mg/dL — ABNORMAL LOW (ref 8.6–10.4)
Creat: 0.67 mg/dL (ref 0.60–0.93)
GFR, Est African American: 101 mL/min/{1.73_m2} (ref >=60)
GFR, Est Non African American: 87 mL/min/{1.73_m2} (ref >=60)
GLUCOSE: 83 mg/dL (ref 65–99)
Globulin: 2.5 g/dL (calc) (ref 1.9–3.7)
Potassium: 3.3 mmol/L — ABNORMAL LOW (ref 3.5–5.3)
Sodium: 147 mmol/L — ABNORMAL HIGH (ref 135–146)
TOTAL PROTEIN: 5.2 g/dL — AB (ref 6.1–8.1)

## 2018-09-21 LAB — B12 AND FOLATE PANEL
FOLATE: 6.9 ng/mL
Vitamin B-12: >2000 pg/mL — ABNORMAL HIGH (ref 200–1100)

## 2018-09-21 LAB — CBC WITH DIFFERENTIAL/PLATELET
BASOS ABS: 33 {cells}/uL (ref 0–200)
Basophils Relative: 0.4 %
EOS ABS: 83 {cells}/uL (ref 15–500)
EOS PCT: 1 %
HEMATOCRIT: 32.1 % — AB (ref 35.0–45.0)
Hemoglobin: 10.6 g/dL — ABNORMAL LOW (ref 11.7–15.5)
Lymphs Abs: 1643 cells/uL (ref 850–3900)
MCH: 29.6 pg (ref 27.0–33.0)
MCHC: 33 g/dL (ref 32.0–36.0)
MCV: 89.7 fL (ref 80.0–100.0)
MPV: 9.1 fL (ref 7.5–12.5)
Monocytes Relative: 7.8 %
NEUTROS PCT: 71 %
Neutro Abs: 5893 cells/uL (ref 1500–7800)
Platelets: 617 10*3/uL — ABNORMAL HIGH (ref 140–400)
RBC: 3.58 10*6/uL — ABNORMAL LOW (ref 3.80–5.10)
RDW: 13 % (ref 11.0–15.0)
TOTAL LYMPHOCYTE: 19.8 %
WBC mixed population: 647 cells/uL (ref 200–950)
WBC: 8.3 10*3/uL (ref 3.8–10.8)

## 2018-09-21 LAB — IRON,TIBC AND FERRITIN PANEL
%SAT: 39 % (calc) (ref 16–45)
Ferritin: 93 ng/mL (ref 16–288)
Iron: 58 ug/dL (ref 45–160)
TIBC: 150 mcg/dL (calc) — ABNORMAL LOW (ref 250–450)

## 2018-09-21 LAB — TSH: TSH: 1.05 m[IU]/L (ref 0.40–4.50)

## 2018-09-21 LAB — SEDIMENTATION RATE: Sed Rate: 28 mm/h (ref 0–30)

## 2018-09-24 ENCOUNTER — Encounter: Payer: Self-pay | Admitting: Gastroenterology

## 2018-09-26 ENCOUNTER — Other Ambulatory Visit: Payer: Self-pay | Admitting: *Deleted

## 2018-09-26 DIAGNOSIS — R634 Abnormal weight loss: Secondary | ICD-10-CM

## 2018-09-26 DIAGNOSIS — M6281 Muscle weakness (generalized): Secondary | ICD-10-CM

## 2018-09-26 DIAGNOSIS — E875 Hyperkalemia: Secondary | ICD-10-CM

## 2018-09-26 DIAGNOSIS — R7989 Other specified abnormal findings of blood chemistry: Secondary | ICD-10-CM

## 2018-09-27 ENCOUNTER — Other Ambulatory Visit: Payer: Self-pay | Admitting: *Deleted

## 2018-10-01 ENCOUNTER — Other Ambulatory Visit: Payer: Self-pay

## 2018-10-01 ENCOUNTER — Ambulatory Visit (INDEPENDENT_AMBULATORY_CARE_PROVIDER_SITE_OTHER): Payer: Medicare Other

## 2018-10-01 DIAGNOSIS — M5126 Other intervertebral disc displacement, lumbar region: Secondary | ICD-10-CM | POA: Diagnosis not present

## 2018-10-01 DIAGNOSIS — M6281 Muscle weakness (generalized): Secondary | ICD-10-CM | POA: Diagnosis not present

## 2018-10-01 DIAGNOSIS — R296 Repeated falls: Secondary | ICD-10-CM | POA: Diagnosis not present

## 2018-10-01 DIAGNOSIS — E875 Hyperkalemia: Secondary | ICD-10-CM

## 2018-10-01 DIAGNOSIS — Z9181 History of falling: Secondary | ICD-10-CM | POA: Diagnosis not present

## 2018-10-01 DIAGNOSIS — R937 Abnormal findings on diagnostic imaging of other parts of musculoskeletal system: Secondary | ICD-10-CM | POA: Diagnosis not present

## 2018-10-01 DIAGNOSIS — R7989 Other specified abnormal findings of blood chemistry: Secondary | ICD-10-CM

## 2018-10-01 DIAGNOSIS — M21372 Foot drop, left foot: Secondary | ICD-10-CM

## 2018-10-01 DIAGNOSIS — M549 Dorsalgia, unspecified: Secondary | ICD-10-CM

## 2018-10-01 DIAGNOSIS — M48061 Spinal stenosis, lumbar region without neurogenic claudication: Secondary | ICD-10-CM | POA: Diagnosis not present

## 2018-10-02 LAB — BASIC METABOLIC PANEL WITH GFR
BUN: 12 mg/dL (ref 7–25)
CO2: 28 mmol/L (ref 20–32)
CREATININE: 0.66 mg/dL (ref 0.60–0.93)
Calcium: 8.2 mg/dL — ABNORMAL LOW (ref 8.6–10.4)
Chloride: 106 mmol/L (ref 98–110)
GFR, EST NON AFRICAN AMERICAN: 88 mL/min/{1.73_m2} (ref >=60)
GFR, Est African American: 102 mL/min/{1.73_m2} (ref >=60)
Glucose, Bld: 96 mg/dL (ref 65–99)
Potassium: 3.4 mmol/L — ABNORMAL LOW (ref 3.5–5.3)
SODIUM: 146 mmol/L (ref 135–146)

## 2018-10-02 LAB — PTH, INTACT AND CALCIUM
Calcium: 8.2 mg/dL — ABNORMAL LOW (ref 8.6–10.4)
PTH: 31 pg/mL (ref 14–64)

## 2018-10-05 ENCOUNTER — Ambulatory Visit (INDEPENDENT_AMBULATORY_CARE_PROVIDER_SITE_OTHER): Payer: Medicare Other | Admitting: Family Medicine

## 2018-10-05 ENCOUNTER — Ambulatory Visit (HOSPITAL_BASED_OUTPATIENT_CLINIC_OR_DEPARTMENT_OTHER)
Admission: RE | Admit: 2018-10-05 | Discharge: 2018-10-05 | Disposition: A | Discharge status: Home / Self Care | Payer: Medicare Other | Source: Ambulatory Visit | Attending: Family Medicine | Admitting: Family Medicine

## 2018-10-05 ENCOUNTER — Encounter: Payer: Self-pay | Admitting: Family Medicine

## 2018-10-05 VITALS — BP 122/71 | HR 103 | Ht 60.0 in | Wt 113.0 lb

## 2018-10-05 DIAGNOSIS — D510 Vitamin B12 deficiency anemia due to intrinsic factor deficiency: Secondary | ICD-10-CM | POA: Diagnosis not present

## 2018-10-05 DIAGNOSIS — R748 Abnormal levels of other serum enzymes: Secondary | ICD-10-CM

## 2018-10-05 DIAGNOSIS — R799 Abnormal finding of blood chemistry, unspecified: Secondary | ICD-10-CM | POA: Diagnosis not present

## 2018-10-05 DIAGNOSIS — R55 Syncope and collapse: Secondary | ICD-10-CM

## 2018-10-05 DIAGNOSIS — K257 Chronic gastric ulcer without hemorrhage or perforation: Secondary | ICD-10-CM | POA: Diagnosis not present

## 2018-10-05 DIAGNOSIS — R011 Cardiac murmur, unspecified: Secondary | ICD-10-CM | POA: Insufficient documentation

## 2018-10-05 DIAGNOSIS — R29898 Other symptoms and signs involving the musculoskeletal system: Secondary | ICD-10-CM | POA: Diagnosis not present

## 2018-10-05 DIAGNOSIS — M6281 Muscle weakness (generalized): Secondary | ICD-10-CM | POA: Diagnosis not present

## 2018-10-05 DIAGNOSIS — R7989 Other specified abnormal findings of blood chemistry: Secondary | ICD-10-CM

## 2018-10-05 LAB — ECHOCARDIOGRAM COMPLETE
Height: 60 in
Weight: 1808 oz

## 2018-10-05 MED ORDER — AMITRIPTYLINE HCL 25 MG PO TABS: 25.0000 mg | ORAL_TABLET | Freq: Every day | ORAL | 3 refills | Status: DC

## 2018-10-05 MED ORDER — LANSOPRAZOLE 30 MG PO TBDD: 30.0000 mg | DELAYED_RELEASE_TABLET | ORAL | 2 refills | Status: DC

## 2018-10-05 MED ORDER — SUCRALFATE 1 G PO TABS: 1.0000 g | ORAL_TABLET | Freq: Three times a day (TID) | ORAL | 2 refills | Status: DC

## 2018-10-05 MED ORDER — PREDNISONE 20 MG PO TABS: 40.0000 mg | ORAL_TABLET | Freq: Every day | ORAL | 0 refills | Status: DC

## 2018-10-05 NOTE — Progress Notes (Signed)
  Echocardiogram 2D Echocardiogram has been performed.  Darlina Sicilian M 10/05/2018, 3:37 PM

## 2018-10-05 NOTE — Addendum Note (Signed)
Addended by: Lavon Paganini on: 10/05/2018 09:46 AM   Modules accepted: Orders

## 2018-10-05 NOTE — Progress Notes (Signed)
Subjective:    CC:   HPI:  72 year old female is here today for 2-week follow-up for syncope.  She had had a couple of events before I saw her 2 weeks ago which were doing some additional work-up.  MRI of her brain November 16 which just showed some mild chronic small vessel ischemic disease but no other concerning findings. She is not moving her right arm as well as her left and she has been dragging her left foot. she is scheduled with Neurology on 1/22.     She feels like the weakness in her legs is actually getting worse.  Now she is having some difficulty and weakness in her right leg as well just not as much is in her left.  She also finds it hard to lift her arms to 90 degrees.  She says it is painful.  Though she really denies any significant pain or discomfort in her hips or her legs.  She really just feels like it is the weakness and says that it really started in October  She is also noted to have hypercalcemia on her recent labs.  Parathyroid hormone levels were now normal so recommended that she start a calcium supplement.   Here today with her husband Jeneen Rinks.    Past medical history, Surgical history, Family history not pertinant except as noted below, Social history, Allergies, and medications have been entered into the medical record, reviewed, and corrections made.   Review of Systems: No fevers, chills, night sweats, weight loss, chest pain, or shortness of breath.   Objective:    General: Well Developed, well nourished, and in no acute distress.  Neuro: Alert and oriented x3, extra-ocular muscles intact, sensation grossly intact.  HEENT: Normocephalic, atraumatic  Skin: Warm and dry, no rashes. Cardiac: Regular rate and rhythm, no murmurs rubs or gallops, no lower extremity edema.  Respiratory: Clear to auscultation bilaterally. Not using accessory muscles, speaking in full sentences.   Impression and Recommendations:   Syncope -she does have her echocardiogram  scheduled today to rule out any issues with her valves that could be causing her problem.  Brain MRI was negative.  She does have an appoint with neurology coming up at the end of January.  Hypocalcemia-encouraged her to start over-the-counter supplement and plan to recheck levels in 3 to 4 weeks.  Gastric ulcer-we will refill her Carafate and will switch her to Prevacid off of Protonix.  It sounds like she did have a prescription for ranitidine and misoprostol but is not actually taking those.  Compression fracture of thoracic spine-we did do an MRI of her lumbar and thoracic spine where she is been having some intermittent pain.  It looks like she had an old fracture but nothing new causing an acute issues.  Lower extremity and upper extremity weakness-she went has little difficulty just lifting one leg over the other to cross her legs.  I think at this point we may need to consider autoimmune work-up so we will check sed rate CRP, ANA, CK.  Consider polymyalgia rheumatica though she is not having a lot of pain she is really just having weakness.  Going to try a burst of prednisone over the weekend but will need to be very cautious especially with her chronic gastric ulcer.  Hypocalcemia - says she wills start Viactiv chews  Elevated B12 level-she reports of history of low B12 in fact she was previously taking injections up until about 6 to 12 months ago that was the last  time she had 1 and she is not taking any oral B12 supplementation so it is unusual that her B12 was so high that it was greater than 2000.

## 2018-10-08 LAB — C-REACTIVE PROTEIN: CRP: 1.9 mg/L (ref <8.0)

## 2018-10-08 LAB — SEDIMENTATION RATE: Sed Rate: 6 mm/h (ref 0–30)

## 2018-10-08 LAB — FERRITIN: Ferritin: 100 ng/mL (ref 16–288)

## 2018-10-08 LAB — VITAMIN B12: Vitamin B-12: >2000 pg/mL — ABNORMAL HIGH (ref 200–1100)

## 2018-10-08 LAB — CK: Total CK: 38 U/L (ref 29–143)

## 2018-10-08 LAB — ANA: Anti Nuclear Antibody(ANA): NEGATIVE

## 2018-10-14 ENCOUNTER — Other Ambulatory Visit: Payer: Self-pay | Admitting: Family Medicine

## 2018-10-16 ENCOUNTER — Ambulatory Visit (INDEPENDENT_AMBULATORY_CARE_PROVIDER_SITE_OTHER): Payer: Medicare Other | Admitting: Gastroenterology

## 2018-10-16 ENCOUNTER — Encounter: Payer: Self-pay | Admitting: Gastroenterology

## 2018-10-16 VITALS — HR 107 | Ht 59.75 in | Wt 110.0 lb

## 2018-10-16 DIAGNOSIS — R1013 Epigastric pain: Secondary | ICD-10-CM | POA: Diagnosis not present

## 2018-10-16 DIAGNOSIS — K289 Gastrojejunal ulcer, unspecified as acute or chronic, without hemorrhage or perforation: Secondary | ICD-10-CM | POA: Diagnosis not present

## 2018-10-16 NOTE — Patient Instructions (Signed)
If you are age 74 or older, your body mass index should be between 23-30. Your Body mass index is 21.66 kg/m. If this is out of the aforementioned range listed, please consider follow up with your Primary Care Provider.  If you are age 48 or younger, your body mass index should be between 19-25. Your Body mass index is 21.66 kg/m. If this is out of the aformentioned range listed, please consider follow up with your Primary Care Provider.   You have been scheduled for an endoscopy. Please follow written instructions given to you at your visit today. If you use inhalers (even only as needed), please bring them with you on the day of your procedure. Your physician has requested that you go to www.startemmi.com and enter the access code given to you at your visit today. This web site gives a general overview about your procedure. However, you should still follow specific instructions given to you by our office regarding your preparation for the procedure.  It was a pleasure to see you today!  Vito Cirigliano, D.O.

## 2018-10-16 NOTE — Progress Notes (Signed)
Chief Complaint: Abdominal pain, marginal ulcer   Referring Provider:     Genoveva Ill, MD    HPI:     Carrie Shannon is a 73 y.o. female referred to the Gastroenterology Clinic for evaluation of abdominal pain and persistent marginal ulcer.  She states she has pain across the upper abdomen for multiple years. Pain is all day long and can be debilitating to the point of not wanting to get out of bed. Worse in the last 6-8 weeks.  Marginal ulcer has been present for approximately 10 years per patient.  She does not smoke and denies any NSAIDs.  Recent ER evaluation for ongoing pain. Currently taking Protonix 40 mg BID, Carafate 1 gm QID, and Amitryptine 25 mg qhs. Was recenlty prescribed course of Prevacid in place of Protonix, but she never made this change.    Previously treated with course of Zantac and misoprostil QID by Dr. Toney Rakes at [facility], without clinical improvement.  She has had extensive evaluation for her recurrent marginal ulcer in the past, most recently seen by Rowe Clack, PA at [facility] on 09/10/2018.  Plan at that time for continued Protonix, Carafate, amitriptyline.  Her most recent EGD was 03/2018 by Dr. Shary Key at [facility] demonstrating anastomotic ulcer and hiatal hernia.  Ulcer was biopsied but no pathology report available for review.  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  No report available -2013: EGD.  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  Refer to Bariatric Surgery for nonhealing ulcer and possible partial obstruction at the outlet of the anastomosis - 04/2018: Seen by Dr. Evorn Gong who recommend referral to tertiary care center - 04/2018: Seen by Dr.  Toney Rakes at [facility] to discuss possible surgical options.  Plan was to obtain her previous UGI series and follow-up.  Gastrin elevated at 151. - CT abdomen/pelvis 08/2018 without acute intra-abdominal pathology, compression deformity of T10  She separately has a history of diarrhea treated with Questran.  She is not endorsing any issues with diarrhea today.  Separately, she was recently prescribed Prednisone 40 mg x5 days for lethargy.  Endoscopic history: Colonoscopy (2009): No report available EGD (2012): No report available EGD (2013): No report available Colonoscopy (12/2016): Diverticulosis EGD (12/2016): Anastomotic ulcer EGD (03/2018): Anastomotic ulcer, hiatal hernia  Past Medical History:  Diagnosis Date  . AC (acromioclavicular) joint bone spurs    heels  . Depression   . Elbow fracture, right   . Esophageal ring   . GERD (gastroesophageal reflux disease)   . Hiatal hernia    EGD 10-27-10/[facility]  . Shoulder fracture, right   . Wrist fracture, right      Past Surgical History:  Procedure Laterality Date  . ABDOMINAL HYSTERECTOMY  1980's    Fibroids  . CHOLECYSTECTOMY  2009  . COLONOSCOPY WITH ESOPHAGOGASTRODUODENOSCOPY (EGD)  12/21/2016  . ESOPHAGOGASTRODUODENOSCOPY  03/16/2018   [facility]  . GASTRIC BYPASS  2003  . TUBAL LIGATION  1975   Family History  Problem Relation Age of Onset  . Cancer Father        esophageal and lung cancer. Said it spread all over  . Heart attack Mother   .  Stroke Mother 43  . Colon cancer Neg Hx    Social History   Tobacco Use  . Smoking status: Never Smoker  . Smokeless tobacco: Never Used  Substance Use Topics  . Alcohol use: No  . Drug use: No   Current Outpatient Medications  Medication Sig Dispense Refill  . amitriptyline (ELAVIL) 25 MG tablet Take 1 tablet (25 mg total) by mouth at bedtime. 90 tablet 3  . ondansetron (ZOFRAN) 4 MG tablet Take 4  mg by mouth every 8 (eight) hours as needed.  1  . pantoprazole (PROTONIX) 40 MG tablet Take 40 mg by mouth 2 (two) times daily.    . predniSONE (DELTASONE) 20 MG tablet Take 2 tablets (40 mg total) by mouth daily with breakfast. 10 tablet 0  . sucralfate (CARAFATE) 1 g tablet Take 1 tablet (1 g total) by mouth 4 (four) times daily -  with meals and at bedtime. 120 tablet 2   No current facility-administered medications for this visit.    Allergies  Allergen Reactions  . Doxepin Other (See Comments)    Headaches  Other reaction(s): Other Headaches Headaches   . Other Rash  . Tape Rash    Band aids     Review of Systems: All systems reviewed and negative except where noted in HPI.     Physical Exam:    Wt Readings from Last 3 Encounters:  10/16/18 110 lb (49.9 kg)  10/05/18 113 lb (51.3 kg)  09/20/18 114 lb (51.7 kg)    Pulse (!) 107   Ht 4' 11.75" (1.518 m)   Wt 110 lb (49.9 kg)   BMI 21.66 kg/m  Constitutional:  Pleasant, in no acute distress. Psychiatric: Normal mood and affect. Behavior is normal. EENT: Pupils normal.  Conjunctivae are normal. No scleral icterus. Neck supple. No cervical LAD. Cardiovascular: Normal rate, regular rhythm. No edema Pulmonary/chest: Effort normal and breath sounds normal. No wheezing, rales or rhonchi. Abdominal: Soft, nondistended, nontender. Bowel sounds active throughout. There are no masses palpable. No hepatomegaly. Neurological: Alert and oriented to person place and time. Skin: Skin is warm and dry. No rashes noted.   ASSESSMENT AND PLAN;   Carrie Shannon is a 73 y.o. female presenting with persistent/recurrent marginal ulcer.  Had an otherwise uncomplicated RYGB in 1540 but has been dealing with marginal ulcer for the last 10 years or so.  She has been appropriately treated with PPI, H2 RA, Carafate, misoprostol by both GI and Bariatric Surgery, with escalating therapies in the last year or so.  She continues to  endorse ongoing epigastric pain.  I reviewed her available previous EGD from 03/2018.  There is certainly a marginal ulcer and what appears to be luminal narrowing at the anastomosis.  There also appears to be an exposed surgical staple immediately adjacent to the ulcer.  We discussed nonhealing/recurrent marginal ulcers at length and will proceed as below:  -Repeat upper endoscopy now to reassess marginal ulcer, possible anastomotic stricture, and also offered to remove any exposed surgical staples that could be serving as a nidus for ongoing ulcer - Plan for adding Prevacid Solutabs and Pepcid +/- more Misoprostil pending EGD findings - Resume PPI and Carafate for now -Ultimately, did discuss that given the duration of her marginal ulcer and seemingly refractory nature to multiple appropriate agents over the last year or so, in conjunction with the report of narrowing at the anastomosis, that she may ultimately have some degree of ischemia or fixed stricturing that would  require surgical intervention in the future.  However, certainly willing to take another evaluation particularly if able to remove a surgical staple that could potentially aid in ulcer healing along with quadruple therapy as outlined above.  If still with ongoing ulcer and/or symptoms, then will refer back to Dr. Toney Rakes for consideration of surgery.    The indications, risks, and benefits of EGD were explained to the patient in detail. Risks include but are not limited to bleeding, perforation, adverse reaction to medications, and cardiopulmonary compromise. Sequelae include but are not limited to the possibility of surgery, hositalization, and mortality. The patient verbalized understanding and wished to proceed. All questions answered, referred to scheduler. Further recommendations pending results of the exam.    Lavena Bullion, DO, FACG  10/16/2018, 9:34 AM   Hali Marry, *

## 2018-10-17 ENCOUNTER — Ambulatory Visit (AMBULATORY_SURGERY_CENTER): Payer: Medicare Other | Admitting: Gastroenterology

## 2018-10-17 ENCOUNTER — Encounter: Payer: Self-pay | Admitting: Gastroenterology

## 2018-10-17 VITALS — BP 128/74 | HR 94 | Temp 96.8°F | Resp 17 | Ht 59.5 in | Wt 110.0 lb

## 2018-10-17 DIAGNOSIS — K449 Diaphragmatic hernia without obstruction or gangrene: Secondary | ICD-10-CM

## 2018-10-17 DIAGNOSIS — R1013 Epigastric pain: Secondary | ICD-10-CM

## 2018-10-17 DIAGNOSIS — K319 Disease of stomach and duodenum, unspecified: Secondary | ICD-10-CM | POA: Diagnosis not present

## 2018-10-17 DIAGNOSIS — K219 Gastro-esophageal reflux disease without esophagitis: Secondary | ICD-10-CM | POA: Diagnosis not present

## 2018-10-17 DIAGNOSIS — K289 Gastrojejunal ulcer, unspecified as acute or chronic, without hemorrhage or perforation: Secondary | ICD-10-CM

## 2018-10-17 DIAGNOSIS — K317 Polyp of stomach and duodenum: Secondary | ICD-10-CM | POA: Diagnosis not present

## 2018-10-17 DIAGNOSIS — K3189 Other diseases of stomach and duodenum: Secondary | ICD-10-CM | POA: Diagnosis not present

## 2018-10-17 MED ORDER — SODIUM CHLORIDE 0.9 % IV SOLN: 500.0000 mL | Freq: Once | INTRAVENOUS | Status: DC

## 2018-10-17 MED ORDER — LANSOPRAZOLE 30 MG PO CPDR: 30.0000 mg | DELAYED_RELEASE_CAPSULE | Freq: Two times a day (BID) | ORAL | 1 refills | Status: DC

## 2018-10-17 MED ORDER — MISOPROSTOL 100 MCG PO TABS: 100.0000 ug | ORAL_TABLET | Freq: Four times a day (QID) | ORAL | 1 refills | Status: DC

## 2018-10-17 MED ORDER — FAMOTIDINE 20 MG PO TABS: 20.0000 mg | ORAL_TABLET | Freq: Two times a day (BID) | ORAL | 1 refills | Status: DC

## 2018-10-17 NOTE — Progress Notes (Signed)
Report to PACU, RN, vss, BBS= Clear.  

## 2018-10-17 NOTE — Op Note (Signed)
[place] Patient Name: Carrie Shannon Procedure Date: 10/17/2018 12:18 PM MRN: [id number] Endoscopist: Gerrit Heck , MD Age: 73 Referring MD:  Date of Birth: 1946-08-26 Gender: Female Account #: [id number] Procedure:                Upper GI endoscopy Indications:              Epigastric abdominal pain, Chronic gastrojejunal                            ulcer                           73 yo female with history of RYGB in 2003 with                            persistent/recurrent marginal ulcer despite high                            dose acid suppression therapy and Carafate along                            with previous treatment with misoprostil. She                            presents for endoscopic evaluation for remnant                            surgical staple. Medicines:                Monitored Anesthesia Care Procedure:                Pre-Anesthesia Assessment:                           - Prior to the procedure, a History and Physical                            was performed, and patient medications and                            allergies were reviewed. The patient's tolerance of                            previous anesthesia was also reviewed. The risks                            and benefits of the procedure and the sedation                            options and risks were discussed with the patient.                            All questions were answered, and informed consent  was obtained. Prior Anticoagulants: The patient has                            taken no previous anticoagulant or antiplatelet                            agents. ASA Grade Assessment: II - A patient with                            mild systemic disease. After reviewing the risks                            and benefits, the patient was deemed in                            satisfactory condition to undergo the procedure.                           After obtaining  informed consent, the endoscope was                            passed under direct vision. Throughout the                            procedure, the patient's blood pressure, pulse, and                            oxygen saturations were monitored continuously. The                            Model GIF-HQ190 [id number]) scope was introduced                            through the mouth, and advanced to the jejunum. The                            upper GI endoscopy was accomplished without                            difficulty. The patient tolerated the procedure                            well. Scope In: Scope Out: Findings:                 Esophagogastric landmarks were identified: the                            Z-line was found at 34 cm, the gastroesophageal                            junction was found at 34 cm and the site of hiatal  narrowing was found at 37 cm from the incisors.                           A 3 cm hiatal hernia was present.                           The upper third of the esophagus and middle third                            of the esophagus were normal.                           Evidence of a Roux-en-Y gastrojejunostomy was                            found. The gastrojejunal anastomosis was                            characterized by two large marginal ulcers,                            measuring 10 mm and 15 mm in size. There was mild                            stenosis of the pouch-to-jejunum anastamosis. This                            was traversed. Biopsies were taken with a cold                            forceps for Helicobacter pylori testing.                            Interestingly, the larger of the 2 ulcers bled less                            than expected with cold forceps. Estimated blood                            loss was minimal. Of note, no surgical staple or                            sutures noted on this examination.                            Moderately erythematous mucosa without bleeding was                            found in the gastric body. Biopsies were taken with                            a cold forceps for Helicobacter pylori testing.  Estimated blood loss was minimal.                           The examined jejunum was normal. Complications:            No immediate complications. Estimated Blood Loss:     Estimated blood loss was minimal. Impression:               - Esophagogastric landmarks identified.                           - 3 cm hiatal hernia.                           - Normal upper third of esophagus and middle third                            of esophagus.                           - Roux-en-Y gastrojejunostomy with gastrojejunal                            anastomosis characterized by large marginal ulcers.                            Biopsied.                           - Erythematous mucosa in the gastric body. Biopsied.                           - Normal examined jejunum. Recommendation:           - Patient has a contact number available for                            emergencies. The signs and symptoms of potential                            delayed complications were discussed with the                            patient. Return to normal activities tomorrow.                            Written discharge instructions were provided to the                            patient.                           - Resume previous diet today.                           - Continue present medications.                           -  Await pathology results.                           - Follow-up with Dr. Toney Rakes at Bariatric Surgery                            at appointment to be scheduled.                           - Stop Protonix and change to Prevacid                            (lansoprazole) 30 mg PO BID for 8 weeks.                           - Start Pepcid (famotidine) 20 mg  PO BID for 8                            weeks.                           - Start misoprostol 100 micrograms PO QID for 8                            weeks.                           - Resume Carafate as prescribed.                           - Follow-up with Dr. Bryan Lemma in 4-6 weeks. Gerrit Heck, MD 10/17/2018 2:25:12 PM

## 2018-10-17 NOTE — Progress Notes (Signed)
Pt states that her legs have "not been working like they're supposed to for the last month."  Pt has had several falls.  Pt has bilateral hand swelling, which she states she has had for the past few days

## 2018-10-17 NOTE — Patient Instructions (Signed)
YOU HAD AN ENDOSCOPIC PROCEDURE TODAY AT [facility]:   Refer to the procedure report that was given to you for any specific questions about what was found during the examination.  If the procedure report does not answer your questions, please call your gastroenterologist to clarify.  If you requested that your care partner not be given the details of your procedure findings, then the procedure report has been included in a sealed envelope for you to review at your convenience later.  YOU SHOULD EXPECT: Some feelings of bloating in the abdomen. Passage of more gas than usual.  Walking can help get rid of the air that was put into your GI tract during the procedure and reduce the bloating. If you had a lower endoscopy (such as a colonoscopy or flexible sigmoidoscopy) you may notice spotting of blood in your stool or on the toilet paper. If you underwent a bowel prep for your procedure, you may not have a normal bowel movement for a few days.  Please Note:  You might notice some irritation and congestion in your nose or some drainage.  This is from the oxygen used during your procedure.  There is no need for concern and it should clear up in a day or so.  SYMPTOMS TO REPORT IMMEDIATELY:   Following upper endoscopy (EGD)  Vomiting of blood or coffee ground material  New chest pain or pain under the shoulder blades  Painful or persistently difficult swallowing  New shortness of breath  Fever of 100F or higher  Black, tarry-looking stools  For urgent or emergent issues, a gastroenterologist can be reached at any hour by calling [phone].   DIET:  We do recommend a small meal at first, but then you may proceed to your regular diet.  Drink plenty of fluids but you should avoid alcoholic beverages for 24 hours.  ACTIVITY:  You should plan to take it easy for the rest of today and you should NOT DRIVE or use heavy machinery until tomorrow (because of the sedation medicines used  during the test).    FOLLOW UP: Our staff will call the number listed on your records the next business day following your procedure to check on you and address any questions or concerns that you may have regarding the information given to you following your procedure. If we do not reach you, we will leave a message.  However, if you are feeling well and you are not experiencing any problems, there is no need to return our call.  We will assume that you have returned to your regular daily activities without incident.  If any biopsies were taken you will be contacted by phone or by letter within the next 1-3 weeks.  Please call us at [phone] if you have not heard about the biopsies in 3 weeks.   Await for biopsy results Follow up with Dr. Toney Rakes at Bariatric surgery at appointment to schedule Follow up with Dr. Bryan Lemma in 4-6 weeks Stop Protonix Start Pepcid (famotidine) Start Prevacid (lansoprazole) Start misoprostol  SIGNATURES/CONFIDENTIALITY: You and/or your care partner have signed paperwork which will be entered into your electronic medical record.  These signatures attest to the fact that that the information above on your After Visit Summary has been reviewed and is understood.  Full responsibility of the confidentiality of this discharge information lies with you and/or your care-partner.

## 2018-10-17 NOTE — Progress Notes (Signed)
Called to room to assist during endoscopic procedure.  Patient ID and intended procedure confirmed with present staff. Received instructions for my participation in the procedure from the performing physician.  

## 2018-10-18 ENCOUNTER — Telehealth: Payer: Self-pay

## 2018-10-18 ENCOUNTER — Telehealth: Payer: Self-pay | Admitting: *Deleted

## 2018-10-18 NOTE — Telephone Encounter (Signed)
First attempt, left VM.  

## 2018-10-18 NOTE — Telephone Encounter (Signed)
Left message on f/u call 

## 2018-10-22 ENCOUNTER — Encounter: Payer: Self-pay | Admitting: Family Medicine

## 2018-10-22 ENCOUNTER — Ambulatory Visit (INDEPENDENT_AMBULATORY_CARE_PROVIDER_SITE_OTHER): Payer: Medicare Other | Admitting: Family Medicine

## 2018-10-22 ENCOUNTER — Ambulatory Visit (INDEPENDENT_AMBULATORY_CARE_PROVIDER_SITE_OTHER): Payer: Medicare Other

## 2018-10-22 VITALS — BP 108/83 | HR 83 | Ht 60.0 in | Wt 112.0 lb

## 2018-10-22 DIAGNOSIS — M542 Cervicalgia: Secondary | ICD-10-CM | POA: Diagnosis not present

## 2018-10-22 DIAGNOSIS — E8809 Other disorders of plasma-protein metabolism, not elsewhere classified: Secondary | ICD-10-CM | POA: Diagnosis not present

## 2018-10-22 DIAGNOSIS — K760 Fatty (change of) liver, not elsewhere classified: Secondary | ICD-10-CM | POA: Diagnosis not present

## 2018-10-22 DIAGNOSIS — R809 Proteinuria, unspecified: Secondary | ICD-10-CM

## 2018-10-22 DIAGNOSIS — R29898 Other symptoms and signs involving the musculoskeletal system: Secondary | ICD-10-CM

## 2018-10-22 DIAGNOSIS — I459 Conduction disorder, unspecified: Secondary | ICD-10-CM

## 2018-10-22 DIAGNOSIS — R634 Abnormal weight loss: Secondary | ICD-10-CM

## 2018-10-22 DIAGNOSIS — M6281 Muscle weakness (generalized): Secondary | ICD-10-CM | POA: Diagnosis not present

## 2018-10-22 DIAGNOSIS — M7989 Other specified soft tissue disorders: Secondary | ICD-10-CM

## 2018-10-22 LAB — POCT URINALYSIS DIPSTICK
Blood, UA: NEGATIVE
Glucose, UA: NEGATIVE
Leukocytes, UA: NEGATIVE
Nitrite, UA: NEGATIVE
PH UA: 5.5 (ref 5.0–8.0)
Protein, UA: POSITIVE — AB
Spec Grav, UA: >=1.03 — AB (ref 1.010–1.025)
Urobilinogen, UA: 1 E.U./dL

## 2018-10-22 LAB — POCT UA - MICROALBUMIN
CREATININE, POC: 300 mg/dL
Microalbumin Ur, POC: 80 mg/L

## 2018-10-22 MED ORDER — AMBULATORY NON FORMULARY MEDICATION: 0 refills | Status: DC

## 2018-10-22 NOTE — Progress Notes (Addendum)
Acute Office Visit  Subjective:    Patient ID: Carrie Shannon, female    DOB: 1945-12-04, 73 y.o.   MRN: [id number]  Chief Complaint  Patient presents with  . Dizziness    pt is unable to ambulate, or use bathroom w/o assistance her husband reports that she has been like this since her last OV she has gotten worse  . shaky    HPI Patient is in today for weakness, lightheadedness and feeling like she can't walk.  She felt like her symptoms really started progressing in October.  Thus far she has had an MRI of her brain, thoracic spine, lumbar spine because of the left leg weakness, as well as an echocardiogram.  Besides some degenerative disc disease there is been nothing significant to explain her progressing symptoms. pt is unable to ambulate, or use bathroom w/o assistance her husband reports that she has been like this since her last OV she has gotten worse. She has fallen again.  Her husband says now 90% of the time she cannot get up from a sitting position whether it is on the chair or bed or toilet without some type of assistance.  This has progressed significantly over the last 2 weeks.  He reports that she is eating more consistently and seems like her weight may have stabilized.  Though in July she was 136 pounds and she is now down to 112 pounds.  She just feels like her insides are shaking all the time.  She still reports feeling dizzy when she sitting or standing, without necessarily position change.  Also for the last week to week and a half she has been swelling in her feet and hands. Her hand writing has been getting smaller.  No fm hx of parkinson.   MRI of her brain November 16 which just showed some mild chronic small vessel ischemic disease but no other concerning findings. She is not moving her right arm as well as her left and she has been dragging her left foot.she is scheduled with Neurology on 1/22.  She feels like the weakness in her legs is actually getting worse.   Now she is having some difficulty and weakness in her right leg as well just not as much is in her left.  She also finds it hard to lift her arms to 90 degrees.  She says it is painful.  Though she really denies any significant pain or discomfort in her hips or her legs.  She really just feels like it is the weakness and says that it really started in October  Echo: Study Conclusions  - Left ventricle: The cavity size was normal. Wall thickness was   increased in a pattern of mild LVH. Systolic function was mildly   reduced. The estimated ejection fraction was in the range of 45%   to 50%. Diffuse hypokinesis. Doppler parameters are consistent   with abnormal left ventricular relaxation (grade 1 diastolic   dysfunction). - Aortic valve: There was mild regurgitation. - Aortic root: The aortic root are mildly dilated. - Mitral valve: Mildly calcified annulus.   Past Medical History:  Diagnosis Date  . AC (acromioclavicular) joint bone spurs    heels  . Anemia   . Depression   . Elbow fracture, right   . Esophageal ring   . GERD (gastroesophageal reflux disease)   . Heart murmur   . Hiatal hernia    EGD 10-27-10/[facility]  . Shoulder fracture, right   .  Wrist fracture, right     Past Surgical History:  Procedure Laterality Date  . ABDOMINAL HYSTERECTOMY  1980's    Fibroids  . CHOLECYSTECTOMY  2009  . COLONOSCOPY    . COLONOSCOPY WITH ESOPHAGOGASTRODUODENOSCOPY (EGD)  12/21/2016  . ESOPHAGOGASTRODUODENOSCOPY  03/16/2018   [facility]. [facility]  . GASTRIC BYPASS  2003  . TUBAL LIGATION  1975  . UPPER GASTROINTESTINAL ENDOSCOPY      Family History  Problem Relation Age of Onset  . Cancer Father        esophageal and lung cancer. Said it spread all over  . Heart attack Mother   . Stroke Mother 49  . Colon cancer Neg Hx   . Rectal cancer Neg Hx   . Stomach cancer Neg Hx     Social History   Socioeconomic History  . Marital  status: Married    Spouse name: Not on file  . Number of children: 3  . Years of education: Not on file  . Highest education level: Not on file  Occupational History  . Not on file  Social Needs  . Financial resource strain: Not on file  . Food insecurity:    Worry: Not on file    Inability: Not on file  . Transportation needs:    Medical: Not on file    Non-medical: Not on file  Tobacco Use  . Smoking status: Never Smoker  . Smokeless tobacco: Never Used  Substance and Sexual Activity  . Alcohol use: No  . Drug use: No  . 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 Physical activity:    Days per week: Not on file    Minutes per session: Not on file  . Stress: Not on file  Relationships  . Social connections:    Talks on phone: Not on file    Gets together: Not on file    Attends religious service: Not on file    Active member of club or organization: Not on file    Attends meetings of clubs or organizations: Not on file    Relationship status: Not on file  . Intimate partner violence:    Fear of current or ex partner: Not on file    Emotionally abused: Not on file    Physically abused: Not on file    Forced sexual activity: Not on file  Other Topics Concern  . Not on file  Social History Narrative  . Not on file    Outpatient Medications Prior to Visit  Medication Sig Dispense Refill  . amitriptyline (ELAVIL) 25 MG tablet Take 1 tablet (25 mg total) by mouth at bedtime. 90 tablet 3  . famotidine (PEPCID) 20 MG tablet Take 1 tablet (20 mg total) by mouth 2 (two) times daily. 30 tablet 1  . lansoprazole (PREVACID) 30 MG capsule Take 1 capsule (30 mg total) by mouth 2 (two) times daily before a meal. 60 capsule 1  . misoprostol (CYTOTEC) 100 MCG tablet Take 1 tablet (100 mcg total) by mouth 4 (four) times daily. 120 tablet 1  . ondansetron  (ZOFRAN) 4 MG tablet Take 4 mg by mouth every 8 (eight) hours as needed.  1  . sucralfate (CARAFATE) 1 g tablet Take 1 tablet (1 g total) by mouth 4 (four) times daily -  with meals and at bedtime. 120 tablet 2  Facility-Administered Medications Prior to Visit  Medication Dose Route Frequency Provider Last Rate Last Dose  . 0.9 %  sodium chloride infusion  500 mL Intravenous Once Cirigliano, Vito V, DO        Allergies  Allergen Reactions  . Doxepin Other (See Comments)    Headaches  Other reaction(s): Other Headaches Headaches   . Other Rash  . Tape Rash    Band aids    ROS     Objective:    Physical Exam  Constitutional: She is oriented to person, place, and time. She appears well-developed and well-nourished.  HENT:  Head: Normocephalic and atraumatic.  Cardiovascular: Normal rate.  Murmur heard. Heart rhythm is regular and then becomes irregular for a few beats and then becomes irregular again. 2/6 SEM murmur.   Pulmonary/Chest: Effort normal and breath sounds normal.  Musculoskeletal:     Comments: He has pitting edema over both hands and feet up to her knees and into her forearms bilaterally.strength is 4+/5 and symmetric in shoulder, eblow, wrist and finger.  STrenght is 4/5 in right hip flexion and 3-4/5 on the left hip hip. Knee is 4/5 bilaterally and ankle is 4/5 bilaterally.   Neurological: She is alert and oriented to person, place, and time. She displays abnormal reflex. No cranial nerve deficit.  Reflexes absent in the upper and lower extremities.   Skin: Skin is warm and dry.  Psychiatric: She has a normal mood and affect. Her behavior is normal.    BP 108/83   Pulse 83   Ht 5' (1.524 m)   Wt 112 lb (50.8 kg)   SpO2 100%   BMI 21.87 kg/m  Wt Readings from Last 3 Encounters:  10/22/18 112 lb (50.8 kg)  10/17/18 110 lb (49.9 kg)  10/16/18 110 lb (49.9 kg)    There are no preventive care reminders to display for this patient.  There are no  preventive care reminders to display for this patient.   Lab Results  Component Value Date   TSH 1.05 09/20/2018   Lab Results  Component Value Date   WBC 8.0 10/22/2018   HGB 11.3 (L) 10/22/2018   HCT 34.4 (L) 10/22/2018   MCV 92.2 10/22/2018   PLT 454 (H) 10/22/2018   Lab Results  Component Value Date   NA 147 (H) 10/22/2018   K 3.8 10/22/2018   CO2 29 10/22/2018   GLUCOSE 86 10/22/2018   BUN 11 10/22/2018   CREATININE 0.57 (L) 10/22/2018   BILITOT 0.7 10/22/2018   ALKPHOS 81 11/01/2016   AST 19 10/22/2018   ALT 22 10/22/2018   PROT 4.8 (L) 10/22/2018   ALBUMIN 3.8 11/01/2016   CALCIUM 8.1 (L) 10/22/2018   Lab Results  Component Value Date   CHOL 165 11/01/2016   Lab Results  Component Value Date   HDL 101 11/01/2016   Lab Results  Component Value Date   LDLCALC 44 11/01/2016   Lab Results  Component Value Date   TRIG 100 11/01/2016   Lab Results  Component Value Date   CHOLHDL 1.6 11/01/2016   No results found for: HGBA1C     Assessment & Plan:   Problem List Items Addressed This Visit      Digestive   Fatty liver    Seen on CT done 08/2018 at [place].         Other   Swelling of extremity    Likely due to low albumin levels.      Relevant  Orders   POCT urinalysis dipstick (Completed)   Muscle weakness (generalized)   Relevant Orders   MR Cervical Spine Wo Contrast   Hypocalcemia    Likely cause is low albumin levels.  When the calcium level is corrected based on the albumin levels is actually 9.2 which is within the normal range.      Hypoalbuminemia    She has now had low albumin levels for the last year without any specific cause.  And unsure if this is nutritional if there is some issue with the liver with production.  I am not aware of any significant liver disease that should be causing this.  Did have a very small amount of protein loss on her urinalysis but not enough proteinuria that it should be causing a significant deficit  in her serum levels.      Abnormal weight loss   Relevant Orders   MR Cervical Spine Wo Contrast    Other Visit Diagnoses    Upper extremity weakness    -  Primary   Relevant Orders   DG Cervical Spine Complete (Completed)   MR Cervical Spine Wo Contrast   CBC with Differential/Platelet (Completed)   COMPLETE METABOLIC PANEL WITH GFR (Completed)   RPR (Completed)   Weakness of both lower extremities       Relevant Medications   AMBULATORY NON FORMULARY MEDICATION   Other Relevant Orders   DG Cervical Spine Complete (Completed)   MR Cervical Spine Wo Contrast   CBC with Differential/Platelet (Completed)   COMPLETE METABOLIC PANEL WITH GFR (Completed)   RPR (Completed)   Pathologist smear review (Completed)   Skipped heart beats       Relevant Orders   DG Cervical Spine Complete (Completed)   MR Cervical Spine Wo Contrast   CBC with Differential/Platelet (Completed)   COMPLETE METABOLIC PANEL WITH GFR (Completed)   RPR (Completed)   EKG 12-Lead   Proteinuria, unspecified type       Relevant Orders   POCT UA - Microalbumin (Completed)      Unclear etiology.  Finding unrevelaing MRI of brain, thoracic spine, lumbar spine, CT abd/pelvis.  Has appt with Neurology in about 10 days.  Consider ALS.  New rx for rolator.    Extremity swelling - will check for proteinuria. Just had echo about 2 weeks ago. No sign of systolic heart failure.    EKG today shows rate of 87 bpm with left bundle branch block.  Unchanged from previous from December 12.  Meds ordered this encounter  Medications  . AMBULATORY NON FORMULARY MEDICATION    Sig: Medication Name: Rolator    Dispense:  1 each    Refill:  0     Beatrice Lecher, MD

## 2018-10-23 DIAGNOSIS — M6281 Muscle weakness (generalized): Secondary | ICD-10-CM | POA: Insufficient documentation

## 2018-10-23 DIAGNOSIS — M7989 Other specified soft tissue disorders: Secondary | ICD-10-CM | POA: Insufficient documentation

## 2018-10-23 DIAGNOSIS — K760 Fatty (change of) liver, not elsewhere classified: Secondary | ICD-10-CM | POA: Insufficient documentation

## 2018-10-23 DIAGNOSIS — E8809 Other disorders of plasma-protein metabolism, not elsewhere classified: Secondary | ICD-10-CM | POA: Insufficient documentation

## 2018-10-23 LAB — CBC WITH DIFFERENTIAL/PLATELET
Absolute Monocytes: 440 cells/uL (ref 200–950)
BASOS ABS: 32 {cells}/uL (ref 0–200)
Basophils Relative: 0.4 %
Eosinophils Absolute: 8 cells/uL — ABNORMAL LOW (ref 15–500)
Eosinophils Relative: 0.1 %
HCT: 34.4 % — ABNORMAL LOW (ref 35.0–45.0)
Hemoglobin: 11.3 g/dL — ABNORMAL LOW (ref 11.7–15.5)
Lymphs Abs: 1320 cells/uL (ref 850–3900)
MCH: 30.3 pg (ref 27.0–33.0)
MCHC: 32.8 g/dL (ref 32.0–36.0)
MCV: 92.2 fL (ref 80.0–100.0)
MPV: 9.4 fL (ref 7.5–12.5)
Monocytes Relative: 5.5 %
NEUTROS ABS: 6200 {cells}/uL (ref 1500–7800)
Neutrophils Relative %: 77.5 %
Platelets: 454 10*3/uL — ABNORMAL HIGH (ref 140–400)
RBC: 3.73 10*6/uL — ABNORMAL LOW (ref 3.80–5.10)
RDW: 13.9 % (ref 11.0–15.0)
Total Lymphocyte: 16.5 %
WBC: 8 10*3/uL (ref 3.8–10.8)

## 2018-10-23 LAB — COMPLETE METABOLIC PANEL WITH GFR
AG Ratio: 1 (calc) (ref 1.0–2.5)
ALT: 22 U/L (ref 6–29)
AST: 19 U/L (ref 10–35)
Albumin: 2.4 g/dL — ABNORMAL LOW (ref 3.6–5.1)
Alkaline phosphatase (APISO): 93 U/L (ref 33–130)
BUN/Creatinine Ratio: 19 (calc) (ref 6–22)
BUN: 11 mg/dL (ref 7–25)
CO2: 29 mmol/L (ref 20–32)
Calcium: 8.1 mg/dL — ABNORMAL LOW (ref 8.6–10.4)
Chloride: 108 mmol/L (ref 98–110)
Creat: 0.57 mg/dL — ABNORMAL LOW (ref 0.60–0.93)
GFR, Est African American: 107 mL/min/{1.73_m2} (ref >=60)
GFR, Est Non African American: 92 mL/min/{1.73_m2} (ref >=60)
GLUCOSE: 86 mg/dL (ref 65–99)
Globulin: 2.4 g/dL (calc) (ref 1.9–3.7)
Potassium: 3.8 mmol/L (ref 3.5–5.3)
Sodium: 147 mmol/L — ABNORMAL HIGH (ref 135–146)
Total Bilirubin: 0.7 mg/dL (ref 0.2–1.2)
Total Protein: 4.8 g/dL — ABNORMAL LOW (ref 6.1–8.1)

## 2018-10-23 LAB — PATHOLOGIST SMEAR REVIEW

## 2018-10-23 LAB — RPR: RPR: NONREACTIVE

## 2018-10-23 NOTE — Assessment & Plan Note (Addendum)
She has now had low albumin levels for the last year without any specific cause.  And unsure if this is nutritional if there is some issue with the liver with production.  I am not aware of any significant liver disease that should be causing this.  Did have a very small amount of protein loss on her urinalysis but not enough proteinuria that it should be causing a significant deficit in her serum levels.

## 2018-10-23 NOTE — Assessment & Plan Note (Signed)
Likely cause is low albumin levels.  When the calcium level is corrected based on the albumin levels is actually 9.2 which is within the normal range.

## 2018-10-23 NOTE — Assessment & Plan Note (Signed)
Likely due to low albumin levels.

## 2018-10-23 NOTE — Assessment & Plan Note (Signed)
Seen on CT done 08/2018 at [facility].

## 2018-10-24 ENCOUNTER — Other Ambulatory Visit: Payer: Self-pay | Admitting: *Deleted

## 2018-10-24 DIAGNOSIS — K319 Disease of stomach and duodenum, unspecified: Secondary | ICD-10-CM

## 2018-10-24 MED ORDER — LANSOPRAZOLE 30 MG PO CPDR: 30.0000 mg | DELAYED_RELEASE_CAPSULE | Freq: Two times a day (BID) | ORAL | 3 refills | Status: DC

## 2018-10-25 ENCOUNTER — Telehealth: Payer: Self-pay

## 2018-10-25 DIAGNOSIS — K289 Gastrojejunal ulcer, unspecified as acute or chronic, without hemorrhage or perforation: Secondary | ICD-10-CM | POA: Diagnosis not present

## 2018-10-25 DIAGNOSIS — R110 Nausea: Secondary | ICD-10-CM | POA: Diagnosis not present

## 2018-10-25 DIAGNOSIS — Z7409 Other reduced mobility: Secondary | ICD-10-CM | POA: Diagnosis not present

## 2018-10-25 DIAGNOSIS — E778 Other disorders of glycoprotein metabolism: Secondary | ICD-10-CM | POA: Diagnosis not present

## 2018-10-25 DIAGNOSIS — K912 Postsurgical malabsorption, not elsewhere classified: Secondary | ICD-10-CM | POA: Diagnosis not present

## 2018-10-25 DIAGNOSIS — Z9884 Bariatric surgery status: Secondary | ICD-10-CM | POA: Diagnosis not present

## 2018-10-25 NOTE — Telephone Encounter (Signed)
[facility] notified of patient's follow up appt that has been made per the request of Dr. Bryan Lemma post procedure-pt is scheduled on 10/25/2018 at 9:30 am;

## 2018-10-27 ENCOUNTER — Ambulatory Visit (HOSPITAL_BASED_OUTPATIENT_CLINIC_OR_DEPARTMENT_OTHER)
Admission: RE | Admit: 2018-10-27 | Discharge: 2018-10-27 | Disposition: A | Discharge status: Home / Self Care | Payer: Medicare Other | Source: Ambulatory Visit | Attending: Family Medicine | Admitting: Family Medicine

## 2018-10-27 DIAGNOSIS — M6281 Muscle weakness (generalized): Secondary | ICD-10-CM

## 2018-10-27 DIAGNOSIS — M4802 Spinal stenosis, cervical region: Secondary | ICD-10-CM | POA: Diagnosis not present

## 2018-10-27 DIAGNOSIS — R634 Abnormal weight loss: Secondary | ICD-10-CM | POA: Diagnosis not present

## 2018-10-27 DIAGNOSIS — R29898 Other symptoms and signs involving the musculoskeletal system: Secondary | ICD-10-CM | POA: Insufficient documentation

## 2018-10-27 DIAGNOSIS — I459 Conduction disorder, unspecified: Secondary | ICD-10-CM | POA: Insufficient documentation

## 2018-10-31 ENCOUNTER — Encounter: Payer: Self-pay | Admitting: Neurology

## 2018-10-31 ENCOUNTER — Encounter: Payer: Self-pay | Admitting: Gastroenterology

## 2018-10-31 ENCOUNTER — Ambulatory Visit (INDEPENDENT_AMBULATORY_CARE_PROVIDER_SITE_OTHER): Payer: Medicare Other | Admitting: Neurology

## 2018-10-31 ENCOUNTER — Other Ambulatory Visit: Payer: Self-pay

## 2018-10-31 VITALS — BP 133/88 | HR 91 | Resp 18 | Ht 60.0 in | Wt 113.2 lb

## 2018-10-31 DIAGNOSIS — G1229 Other motor neuron disease: Secondary | ICD-10-CM

## 2018-10-31 DIAGNOSIS — G1221 Amyotrophic lateral sclerosis: Secondary | ICD-10-CM

## 2018-10-31 DIAGNOSIS — E531 Pyridoxine deficiency: Secondary | ICD-10-CM | POA: Diagnosis not present

## 2018-10-31 DIAGNOSIS — W19XXXA Unspecified fall, initial encounter: Secondary | ICD-10-CM

## 2018-10-31 DIAGNOSIS — Z79899 Other long term (current) drug therapy: Secondary | ICD-10-CM | POA: Diagnosis not present

## 2018-10-31 DIAGNOSIS — R634 Abnormal weight loss: Secondary | ICD-10-CM | POA: Diagnosis not present

## 2018-10-31 DIAGNOSIS — R269 Unspecified abnormalities of gait and mobility: Secondary | ICD-10-CM | POA: Diagnosis not present

## 2018-10-31 DIAGNOSIS — R531 Weakness: Secondary | ICD-10-CM | POA: Diagnosis not present

## 2018-10-31 NOTE — Patient Instructions (Signed)
[facility] - will schedule Extensive Labwork May need a lumbar puncture or other imaging studies   Fall Prevention in Hospitals, Adult Being a patient in the hospital puts you at risk for falling. Falls can cause serious injury and harm, but they can be prevented. It is important to understand what puts you at risk for falling and what you and your health care team can do to prevent you from falling. If you or a loved one falls at the hospital, it is important to tell hospital staff about it. What increases the risk for falls? Certain conditions and treatments may increase your risk of falling in the hospital. These include:  Being in an unfamiliar environment, especially when using the bathroom at night.  Having surgery.  Being on bed rest.  Taking many medicines or certain types of medicines, such as sleeping pills.  Having tubes in place, such as IV lines or catheters. Other risk factors for falls in a hospital include:  Having difficulty with hearing or vision.  Having a change in thinking or behavior, such as confusion.  Having depression.  Having trouble with balance.  Being a female.  Feeling dizzy.  Needing to use the toilet frequently.  Having fallen during the past three months.  Having low blood pressure. What are some strategies for preventing falls? If you or a loved one has to stay in the hospital:  Ask about which fall prevention strategies will be in place. Do not hesitate to speak up if you notice that the fall prevention plan has changed.  Ask for help moving around, especially after surgery or when feeling unwell.  If you have been asked to call for help when getting up, do not get up by yourself. Asking for help with getting up is for your safety, and the staff is there to help you.  Wear nonskid footwear.  Get up slowly, and sit at the side of the bed for a few minutes before standing up.  Keep items you need, such as the nurse call button or a  phone, close to you so that you do not need to reach for them.  Wear eyeglasses or hearing aids if you have them.  Have someone stay in the hospital with you or your loved one.  Ask if sleeping pills or other medicines that can cause confusion are necessary. What does the hospital staff do to help prevent falls? Hospitals have systems in place to prevent falls and accidents, which may involve:  Discussing your fall risks and making a personalized fall prevention plan.  Checking in regularly to see if you need help.  Placing an arm band on your wrist or a sign near your room to alert other staff of your needs.  Using an alarm on your hospital bed. This is an alarm that goes off if you get out of bed and forget to call for help.  Keeping the bed in a low and locked position.  Keeping the area around the bed and bathroom well-lit and free from clutter.  Keeping your room quiet, so that you can sleep and be well-rested.  Using safety equipment, such as: ? A belt around your waist. ? Walkers, crutches, and other devices for support. ? Safety beds, such as low beds or cushions on the floor next to the bed.  Having a staff person stay with you (one-on-one observation), even when you are using the bathroom. This is for your safety.  Using video monitoring. This allows a staff  member to come to help you if you need help. What other actions can I take to lower my risk of falls?  Check in regularly with your health care provider or pharmacist to review all of the medicines that you take.  Make sure that you have a regular exercise program to stay fit. This will help you maintain your balance.  Talk with a physical therapist or trainer if recommended by your health care provider. They can help you to improve your strength, balance, and endurance.  If you are over age 62: ? Ask your health care provider if you need a calcium or vitamin D supplement. ? Have your eyes and hearing checked  every year. ? Have your feet checked every year. Where to find more information You can find more information about fall prevention from the Centers for Disease Control and Prevention: ImproveLook.cz Summary  Being in an unfamiliar environment, such as the hospital, increases your risk for falling.  If you have been asked to call for help when getting up, do not get up by yourself. Asking for help with getting up is for your safety, and the staff is there to help you.  Ask about which fall prevention strategies will be in place. Do not hesitate to speak up if you notice that the fall prevention plan has changed.  If you or a loved one falls, tell the hospital staff. This is important. This information is not intended to replace advice given to you by your health care provider. Make sure you discuss any questions you have with your health care provider. Document Released: 09/23/2000 Document Revised: 05/10/2017 Document Reviewed: 05/10/2017 Elsevier Interactive Patient Education  2019 [employer].

## 2018-10-31 NOTE — Progress Notes (Signed)
[facility]    Provider:  Dr Jaynee Eagles Referring Provider: Hali Marry, * Primary Care Physician:  Hali Marry, MD  CC:  Weakness, memory changes  HPI:  Carrie Shannon is a 73 y.o. female here as requested by Dr. Madilyn Fireman for muscle weakness. PMHx heart murmur, GERD, esophageal ring, depression, AC joint bone spurs, syncope, ulcer, fatty liver, left lumbar radiculitis, osteoarthritis, bariatric surgery, pernicious anemia, vitamin D deficiency, left arm weakness, left foot drop, muscle weakness generalized.  Husband is here and provides most information. She was fine in September. In October she was dragging her left leg and holding her left arm. She lost her appetite about the same time. She has had extensive imaging of her neuro axis. She was walking strange but no problems with speech. She had bypass surgery a few years ago and has an ulcer. In constant pain and maybe this is due to her decrease appetite. She had an episode of syncope getting out of bed. She had another syncopal event later that day. Since then she drove herself around until the beginning of December. But since the 12th she can hardle get out of bed, she is immobilzed, has to use a walker. She doesn't eat a lot, she doesn't drink a lot.  She was told to stop medication. She has had difficulty swallowing per husband. No headaches. She is weak all over. She will se a light coming down the tracks. Everything goes dark and blank. No depression or anxiety. She has upcoming sugery for ulcers. Decline since September 3rd everything was fine, started declining, the second week of October it was very noticeable, in early Dece,ber she was still driving but now can' walk. Downhill since October and continues to decline. All started in the setting of not wanting to eat.   Per patient, she felt one morning she couldn;t get out of bed. She thought she had a stroke and she felt different, she ouldn't make  her legs move, both legs were affected, She couldn't stay awake but she was taking lots of medicines, like 15 of them, she felt her medications put her to sleep and she was very fatigued and she finally woke up. Her husband has to help her out of bed, husband has to do everythin. She feels weak when walking and she is now using a walker that helps. The lft leg and the left arm. She feels they are stable at this point. She can't walk far maybe 20 feet without sitting down. The decline was swift maybe a few months she has declined. No pain, no numbnes or tingling. She has pain in her stomach from the ulcer.   Reviewed notes, labs and imaging from outside physicians, which showed:  Review Dr. Fay Records notes. 4 she has had gait instability for about a year where she noticed she is wobbly and easily falls.  She is had multiple falls.  Very difficult at night or when it is dark.  About a month ago her husband noticed she was dragging her foot and dragging her left leg and drawing her right arm inward to her body (this would have been about October 2019) unclear how long this is ongoing EF was sure sure sure see this she reported an unrevealing MRI of the brain, thoracic spine, lumbar spine, CT abdomen and pelvis.  ALS is a possibility to consider.  MRI of the cervical spine was also ordered.  She presented in December of last year with syncope.  She  had 2 episodes one right after she got out of bed she was out for maybe a couple seconds not even a minute.  No chest pain or shortness of breath.  She may have hit her head but says it really did not bother her and it was not painful.  It has not happened again since then.  Some gait instability which was evaluated by MRI of the brain.  Not moving her right arm as well as her left and she has been dragging her left foot.  She continues to have low back pain but no cervical pain. She was 130 and now 110 20 pond weight loss. She has had severe pain daily during this time  and they are pending the ulcer treatment but the pain is improved since then and still declining otherwise.   Review of Systems: Patient complains of symptoms per HPI as well as the following symptoms: muscle weakness, falls, memory loss. Pertinent negatives and positives per HPI. All others negative.   Social History   Socioeconomic History  . Marital status: Married    Spouse name: Jeneen Rinks  . Number of children: 3  . Years of education: 77  . Highest education level: 12th grade  Occupational History  . Occupation: Retired Investment banker, operational work    Comment: town Scientist, clinical (histocompatibility and immunogenetics) for [employer]  . Financial resource strain: Not on file  . Food insecurity:    Worry: Not on file    Inability: Not on file  . Transportation needs:    Medical: Not on file    Non-medical: Not on file  Tobacco Use  . Smoking status: Never Smoker  . Smokeless tobacco: Never Used  Substance and Sexual Activity  . Alcohol use: No  . Drug use: No  . 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 Physical activity:    Days per week: Not on file    Minutes per session: Not on file  . Stress: Not on file  Relationships  . Social connections:    Talks on phone: Not on file    Gets together: Not on file    Attends religious service: Not on file    Active member of club or organization: Not on file    Attends meetings of clubs or organizations: Not on file    Relationship status: Not on file  . Intimate partner violence:    Fear of current or ex partner: Not on file    Emotionally abused: Not on file    Physically abused: Not on file    Forced sexual activity: Not on file  Other Topics Concern  . Not on file  Social History Narrative  . Not on file    Family History  Problem Relation Age of Onset  . Cancer Father        esophageal and lung cancer. Said it spread all over  . Heart  attack Mother   . Stroke Mother 81  . Colon cancer Neg Hx   . Rectal cancer Neg Hx   . Stomach cancer Neg Hx     Past Medical History:  Diagnosis Date  . AC (acromioclavicular) joint bone spurs    heels  . Anemia   . Depression   . Elbow fracture, right   . Esophageal ring   . GERD (gastroesophageal reflux disease)   . Heart murmur   .  Hiatal hernia    EGD 10-27-10/[facility]  . Shoulder fracture, right   . Wrist fracture, right     Past Surgical History:  Procedure Laterality Date  . ABDOMINAL HYSTERECTOMY  1980's    Fibroids  . CHOLECYSTECTOMY  2009  . COLONOSCOPY    . COLONOSCOPY WITH ESOPHAGOGASTRODUODENOSCOPY (EGD)  12/21/2016  . ESOPHAGOGASTRODUODENOSCOPY  03/16/2018   [facility]. [facility] [place]  . GASTRIC BYPASS  2003  . TUBAL LIGATION  1975  . UPPER GASTROINTESTINAL ENDOSCOPY      Current Outpatient Medications  Medication Sig Dispense Refill  . AMBULATORY NON FORMULARY MEDICATION Medication Name: Rolator 1 each 0  . amitriptyline (ELAVIL) 25 MG tablet Take 1 tablet (25 mg total) by mouth at bedtime. 90 tablet 3  . famotidine (PEPCID) 20 MG tablet Take 1 tablet (20 mg total) by mouth 2 (two) times daily. 30 tablet 1  . lansoprazole (PREVACID) 30 MG capsule Take 1 capsule (30 mg total) by mouth 2 (two) times daily before a meal. 180 capsule 3  . ondansetron (ZOFRAN) 4 MG tablet Take 4 mg by mouth every 8 (eight) hours as needed.  1  . sucralfate (CARAFATE) 1 g tablet Take 1 tablet (1 g total) by mouth 4 (four) times daily -  with meals and at bedtime. 120 tablet 2  . misoprostol (CYTOTEC) 100 MCG tablet Take 1 tablet (100 mcg total) by mouth 4 (four) times daily. (Patient not taking: Reported on 10/31/2018) 120 tablet 1   Current Facility-Administered Medications  Medication Dose Route Frequency Provider Last Rate Last Dose  . 0.9 %  sodium chloride infusion  500 mL Intravenous Once Cirigliano, Vito V, DO        Allergies as  of 10/31/2018 - Review Complete 10/31/2018  Allergen Reaction Noted  . Doxepin Other (See Comments) 11/08/2011  . Other Rash 06/15/2017  . Tape Rash 04/26/2018    Vitals: BP 133/88   Pulse 91   Resp 18   Ht 5' (1.524 m)   Wt 113 lb 3.2 oz (51.3 kg)   BMI 22.11 kg/m  Last Weight:  Wt Readings from Last 1 Encounters:  10/31/18 113 lb 3.2 oz (51.3 kg)   Last Height:   Ht Readings from Last 1 Encounters:  10/31/18 5' (1.524 m)     Physical exam: Exam: Gen: NAD, conversant, well nourised, thin , well groomed                     CV: RRR, no MRG. No Carotid Bruits. +peripheral edema, warm, nontender Eyes: Conjunctivae clear without exudates or hemorrhage  Neuro: Detailed Neurologic Exam  Speech:    Speech is normal; fluent without dysarthria or aphasia Cognition:   MMSE - Mini Mental State Exam 10/31/2018  Orientation to time 5  Orientation to Place 3  Registration 3  Attention/ Calculation 4  Recall 1  Language- name 2 objects 2  Language- repeat 1  Language- follow 3 step command 3  Language- read & follow direction 1  Write a sentence 1  Copy design 0  Total score 24    Cranial Nerves:    The pupils are equal, round, and reactive to light. Attempted fundoscopy could not visualize. Visual fields are full to finger confrontation. Extraocular movements are intact. Trigeminal sensation is intact and the muscles of mastication are normal. The face is symmetric. The palate elevates in the midline. Hearing intact. Voice is normal. Shoulder shrug is normal. The tongue has  normal motion without fasciculations.  Mild weakness in cheek puffing,   Coordination: Bradykinetic      Gait: Difficulty getting out of seat needs to use hands. Very imbalanced, low step clearance, narrow stance, imbalance, tenuous, needs to hold on to wall.   Motor Observation:    Mild postural and action tremor Tone:    Normal muscle tone.    Posture:    Posture is normal. normal erect      Strength: Hip flexion 2+/5, leg extension 5/5, leg flexion 4/5, DF 5-/5, deltoids 4/5, biceps 5/5, triceps 4/5, 4/5 prnator teres, weak grip, ulnar weakness, FDP ulnar weakness > FDP median. Head extension 4+/5, flexion 5-/5.       Sensation: intact to LT     Reflex Exam:  DTR's:    Right >> left brisk reflexes. Left AJ trace.    Toes:    The toes are downgoing bilaterally.   Clonus:    Clonus is absent.  No frontal release signs    Assessment/Plan:   73 y.o. female here as requested by Dr. Madilyn Fireman for muscle weakness. PMHx heart murmur, GERD, esophageal ring, depression, AC joint bone spurs, syncope, ulcer, fatty liver, left lumbar radiculitis, osteoarthritis, bariatric surgery, pernicious anemia, vitamin D deficiency, left arm weakness, left foot drop, muscle weakness generalized. She has a rapidly progressive decline in weakness and cognition. Generalized weakness prox >distal and  mild facial weakness, difficulty getting out of chair, abnormality of gait and worsening memory (24/30), dysphagia. There is a wide differential and she will need extensive testing. MRI brain and c-spine without etiology.   Extensive Labs EMG/NCS 4 limb (include EMG of tongue) Swallow evaluation for dusphagia May need a lumbar puncture if above unrevealing  Orders Placed This Encounter  Procedures  . DG OP Swallowing Func-Medicare/Speech Path  . Vitamin B1  . Methylmalonic acid, serum  . Hemoglobin A1c  . B. burgdorfi Antibody  . ANA  . ANA, IFA (with reflex)  . RPR  . TSH  . Thyroglobulin antibody  . Thyroid peroxidase antibody  . Sedimentation rate  . Ammonia  . Acetylcholine receptor, binding  . Acetylcholine receptor, blocking  . Acetylcholine receptor, modulating  . CK  . Angiotensin converting enzyme  . Paraneoplastic Profile 1  . Magnesium  . Sjogren's syndrome antibods(ssa + ssb)  . Pan-ANCA  . B12 and Folate Panel  . Tissue transglutaminase, IgA  . Gliadin antibodies, serum   . Rheumatoid factor  . Heavy metals, blood  . Vitamin B6  . Vitamin E  . Multiple Myeloma Panel (SPEP&IFE w/QIG)  . Copper, serum  . CBC  . Comprehensive metabolic panel  . SLP modified barium swallow  . NCV with EMG(electromyography)     Sarina Ill, MD  [facility] [address] [place], [place] [address]  Phone [phone] Fax [phone]

## 2018-11-01 ENCOUNTER — Telehealth: Payer: Self-pay | Admitting: Neurology

## 2018-11-01 ENCOUNTER — Encounter: Payer: Self-pay | Admitting: Neurology

## 2018-11-01 NOTE — Telephone Encounter (Signed)
Carrie Shannon, can we try to find her a sooner emg/ncs? thanks

## 2018-11-01 NOTE — Telephone Encounter (Signed)
I called pt's husband and offered an EMG/NCS on Thurs 2/6 @ 10:45. He verbalized appreciation and is aware that we will cancel the March EMG since pt will be seen earlier. They will arrive a little early for the EMG.   Appt scheduled for 11/15/18 @ 10:45 and 12/20/18 appt canceled.

## 2018-11-05 DIAGNOSIS — K289 Gastrojejunal ulcer, unspecified as acute or chronic, without hemorrhage or perforation: Secondary | ICD-10-CM | POA: Diagnosis not present

## 2018-11-05 DIAGNOSIS — E43 Unspecified severe protein-calorie malnutrition: Secondary | ICD-10-CM | POA: Diagnosis not present

## 2018-11-05 DIAGNOSIS — Z9884 Bariatric surgery status: Secondary | ICD-10-CM | POA: Diagnosis not present

## 2018-11-05 DIAGNOSIS — Z6820 Body mass index (BMI) 20.0-20.9, adult: Secondary | ICD-10-CM | POA: Diagnosis not present

## 2018-11-06 ENCOUNTER — Telehealth: Payer: Self-pay | Admitting: *Deleted

## 2018-11-06 DIAGNOSIS — Z8711 Personal history of peptic ulcer disease: Secondary | ICD-10-CM | POA: Diagnosis not present

## 2018-11-06 DIAGNOSIS — K9589 Other complications of other bariatric procedure: Secondary | ICD-10-CM | POA: Diagnosis not present

## 2018-11-06 DIAGNOSIS — K219 Gastro-esophageal reflux disease without esophagitis: Secondary | ICD-10-CM | POA: Diagnosis not present

## 2018-11-06 DIAGNOSIS — Z7409 Other reduced mobility: Secondary | ICD-10-CM | POA: Diagnosis not present

## 2018-11-06 DIAGNOSIS — R0602 Shortness of breath: Secondary | ICD-10-CM | POA: Diagnosis not present

## 2018-11-06 DIAGNOSIS — K287 Chronic gastrojejunal ulcer without hemorrhage or perforation: Secondary | ICD-10-CM | POA: Diagnosis not present

## 2018-11-06 DIAGNOSIS — R4182 Altered mental status, unspecified: Secondary | ICD-10-CM | POA: Diagnosis not present

## 2018-11-06 DIAGNOSIS — F05 Delirium due to known physiological condition: Secondary | ICD-10-CM | POA: Diagnosis not present

## 2018-11-06 DIAGNOSIS — Z452 Encounter for adjustment and management of vascular access device: Secondary | ICD-10-CM | POA: Diagnosis not present

## 2018-11-06 DIAGNOSIS — K289 Gastrojejunal ulcer, unspecified as acute or chronic, without hemorrhage or perforation: Secondary | ICD-10-CM | POA: Diagnosis not present

## 2018-11-06 DIAGNOSIS — R410 Disorientation, unspecified: Secondary | ICD-10-CM | POA: Diagnosis not present

## 2018-11-06 DIAGNOSIS — R112 Nausea with vomiting, unspecified: Secondary | ICD-10-CM | POA: Diagnosis not present

## 2018-11-06 DIAGNOSIS — E43 Unspecified severe protein-calorie malnutrition: Secondary | ICD-10-CM | POA: Diagnosis not present

## 2018-11-06 DIAGNOSIS — R29898 Other symptoms and signs involving the musculoskeletal system: Secondary | ICD-10-CM | POA: Diagnosis not present

## 2018-11-06 DIAGNOSIS — Z90710 Acquired absence of both cervix and uterus: Secondary | ICD-10-CM | POA: Diagnosis not present

## 2018-11-06 DIAGNOSIS — R000 Tachycardia, unspecified: Secondary | ICD-10-CM | POA: Diagnosis not present

## 2018-11-06 DIAGNOSIS — Z9884 Bariatric surgery status: Secondary | ICD-10-CM | POA: Diagnosis not present

## 2018-11-06 DIAGNOSIS — Z6820 Body mass index (BMI) 20.0-20.9, adult: Secondary | ICD-10-CM | POA: Diagnosis not present

## 2018-11-06 DIAGNOSIS — R110 Nausea: Secondary | ICD-10-CM | POA: Diagnosis not present

## 2018-11-06 DIAGNOSIS — E46 Unspecified protein-calorie malnutrition: Secondary | ICD-10-CM | POA: Diagnosis not present

## 2018-11-06 DIAGNOSIS — Z789 Other specified health status: Secondary | ICD-10-CM | POA: Diagnosis not present

## 2018-11-06 DIAGNOSIS — E778 Other disorders of glycoprotein metabolism: Secondary | ICD-10-CM | POA: Diagnosis present

## 2018-11-06 DIAGNOSIS — R918 Other nonspecific abnormal finding of lung field: Secondary | ICD-10-CM | POA: Diagnosis not present

## 2018-11-06 DIAGNOSIS — R109 Unspecified abdominal pain: Secondary | ICD-10-CM | POA: Diagnosis not present

## 2018-11-06 DIAGNOSIS — R9431 Abnormal electrocardiogram [ECG] [EKG]: Secondary | ICD-10-CM | POA: Diagnosis not present

## 2018-11-06 DIAGNOSIS — R627 Adult failure to thrive: Secondary | ICD-10-CM | POA: Diagnosis present

## 2018-11-06 LAB — ACETYLCHOLINE RECEPTOR, MODULATING: Acetylcholine Modulat Ab: <12 % (ref 0–20)

## 2018-11-06 LAB — PARANEOPLASTIC PROFILE 1
Neuronal Nuc Ab (Ri), IFA: <1:10 {titer}
Neuronal Nuclear (Hu) Antibody (IB): <1:10 {titer}
Purkinje Cell (Yo) Autoantobodies- IFA: <1:10 {titer}

## 2018-11-06 LAB — SJOGREN'S SYNDROME ANTIBODS(SSA + SSB)
ENA SSA (RO) Ab: <0.2 AI (ref 0.0–0.9)
ENA SSB (LA) Ab: <0.2 AI (ref 0.0–0.9)

## 2018-11-06 LAB — SEDIMENTATION RATE: Sed Rate: 2 mm/hr (ref 0–40)

## 2018-11-06 LAB — COPPER, SERUM: Copper: 55 ug/dL — ABNORMAL LOW (ref 72–166)

## 2018-11-06 LAB — HEAVY METALS, BLOOD
Arsenic: 6 ug/L (ref 2–23)
Lead, Blood: NOT DETECTED ug/dL (ref 0–4)
Mercury: NOT DETECTED ug/L (ref 0.0–14.9)

## 2018-11-06 LAB — MULTIPLE MYELOMA PANEL, SERUM
Albumin SerPl Elph-Mcnc: 2.5 g/dL — ABNORMAL LOW (ref 2.9–4.4)
Albumin/Glob SerPl: 1.1 (ref 0.7–1.7)
Alpha 1: 0.2 g/dL (ref 0.0–0.4)
Alpha2 Glob SerPl Elph-Mcnc: 0.6 g/dL (ref 0.4–1.0)
B-Globulin SerPl Elph-Mcnc: 0.6 g/dL — ABNORMAL LOW (ref 0.7–1.3)
Gamma Glob SerPl Elph-Mcnc: 0.9 g/dL (ref 0.4–1.8)
Globulin, Total: 2.3 g/dL (ref 2.2–3.9)
IgA/Immunoglobulin A, Serum: 324 mg/dL (ref 64–422)
IgG (Immunoglobin G), Serum: 909 mg/dL (ref 700–1600)
IgM (Immunoglobulin M), Srm: 174 mg/dL (ref 26–217)

## 2018-11-06 LAB — CBC
Hematocrit: 36 % (ref 34.0–46.6)
Hemoglobin: 11.4 g/dL (ref 11.1–15.9)
MCH: 30.4 pg (ref 26.6–33.0)
MCHC: 31.7 g/dL (ref 31.5–35.7)
MCV: 96 fL (ref 79–97)
Platelets: 349 10*3/uL (ref 150–450)
RBC: 3.75 x10E6/uL — ABNORMAL LOW (ref 3.77–5.28)
RDW: 14.1 % (ref 11.7–15.4)
WBC: 5.7 10*3/uL (ref 3.4–10.8)

## 2018-11-06 LAB — PAN-ANCA
ANCA Proteinase 3: <3.5 U/mL (ref 0.0–3.5)
Atypical pANCA: <1:20 {titer}
C-ANCA: <1:20 {titer}
P-ANCA: <1:20 {titer}

## 2018-11-06 LAB — VITAMIN E
Vitamin E (Alpha Tocopherol): 6.6 mg/L — ABNORMAL LOW (ref 9.0–29.0)
Vitamin E(Gamma Tocopherol): 2.4 mg/L (ref 0.5–4.9)

## 2018-11-06 LAB — VITAMIN B1: Thiamine: 96.6 nmol/L (ref 66.5–200.0)

## 2018-11-06 LAB — B12 AND FOLATE PANEL
Folate: 11.8 ng/mL (ref >3.0)
Vitamin B-12: 1596 pg/mL — ABNORMAL HIGH (ref 232–1245)

## 2018-11-06 LAB — COMPREHENSIVE METABOLIC PANEL
ALT: 28 IU/L (ref 0–32)
AST: 28 IU/L (ref 0–40)
Albumin/Globulin Ratio: 1.1 — ABNORMAL LOW (ref 1.2–2.2)
Albumin: 2.5 g/dL — ABNORMAL LOW (ref 3.7–4.7)
Alkaline Phosphatase: 90 IU/L (ref 39–117)
BUN/Creatinine Ratio: 18 (ref 12–28)
BUN: 10 mg/dL (ref 8–27)
Bilirubin Total: 0.6 mg/dL (ref 0.0–1.2)
CO2: 25 mmol/L (ref 20–29)
Calcium: 8.3 mg/dL — ABNORMAL LOW (ref 8.7–10.3)
Chloride: 107 mmol/L — ABNORMAL HIGH (ref 96–106)
Creatinine, Ser: 0.55 mg/dL — ABNORMAL LOW (ref 0.57–1.00)
GFR calc Af Amer: 108 mL/min/{1.73_m2} (ref >59)
GFR calc non Af Amer: 93 mL/min/{1.73_m2} (ref >59)
Globulin, Total: 2.3 g/dL (ref 1.5–4.5)
Glucose: 125 mg/dL — ABNORMAL HIGH (ref 65–99)
Potassium: 3.9 mmol/L (ref 3.5–5.2)
Sodium: 146 mmol/L — ABNORMAL HIGH (ref 134–144)
Total Protein: 4.8 g/dL — ABNORMAL LOW (ref 6.0–8.5)

## 2018-11-06 LAB — VITAMIN B6: Vitamin B6: 2.4 ug/L (ref 2.0–32.8)

## 2018-11-06 LAB — ACETYLCHOLINE RECEPTOR, BINDING: AChR Binding Ab, Serum: <0.03 nmol/L (ref 0.00–0.24)

## 2018-11-06 LAB — CK: Total CK: 28 U/L (ref 24–173)

## 2018-11-06 LAB — MAGNESIUM: Magnesium: 1.8 mg/dL (ref 1.6–2.3)

## 2018-11-06 LAB — GLIADIN ANTIBODIES, SERUM
Antigliadin Abs, IgA: 3 units (ref 0–19)
GLIADIN IGG: 2 U (ref 0–19)

## 2018-11-06 LAB — HEMOGLOBIN A1C
Est. average glucose Bld gHb Est-mCnc: 91 mg/dL
HEMOGLOBIN A1C: 4.8 % (ref 4.8–5.6)

## 2018-11-06 LAB — TISSUE TRANSGLUTAMINASE, IGA: Transglutaminase IgA: <2 U/mL (ref 0–3)

## 2018-11-06 LAB — THYROID PEROXIDASE ANTIBODY: Thyroperoxidase Ab SerPl-aCnc: 9 IU/mL (ref 0–34)

## 2018-11-06 LAB — THYROGLOBULIN ANTIBODY: Thyroglobulin Antibody: <1 IU/mL (ref 0.0–0.9)

## 2018-11-06 LAB — RHEUMATOID FACTOR: Rheumatoid fact SerPl-aCnc: <10 IU/mL (ref 0.0–13.9)

## 2018-11-06 LAB — METHYLMALONIC ACID, SERUM: Methylmalonic Acid: 213 nmol/L (ref 0–378)

## 2018-11-06 LAB — ANGIOTENSIN CONVERTING ENZYME: Angio Convert Enzyme: 67 U/L (ref 14–82)

## 2018-11-06 LAB — ANA: ANA Titer 1: NEGATIVE

## 2018-11-06 LAB — B. BURGDORFI ANTIBODIES: Lyme IgG/IgM Ab: <0.91 {ISR} (ref 0.00–0.90)

## 2018-11-06 LAB — AMMONIA: Ammonia: 39 ug/dL (ref 19–87)

## 2018-11-06 LAB — RPR: RPR Ser Ql: NONREACTIVE

## 2018-11-06 LAB — TSH: TSH: 1.37 u[IU]/mL (ref 0.450–4.500)

## 2018-11-06 LAB — ACETYLCHOLINE RECEPTOR, BLOCKING: Acetylchol Block Ab: 15 % (ref 0–25)

## 2018-11-06 NOTE — Telephone Encounter (Signed)
I called the pt's primary number (husband Jeneen Rinks) and LVM (ok per DPR) informing him of pt's lab results. Total protein is a little reduced, pt should examine her diet with her primary care and make sure she is getting enough calories and protein. Labs are otherwise unremarkable, glucose was elevated at 125 however pt may not have been fasting when the labs were taken. I also advised that a couple of labs are still pending and we will call if they are abnormal. I left office number for call back if there are any questions.

## 2018-11-06 NOTE — Telephone Encounter (Signed)
-----   Message from Melvenia Beam, MD sent at 11/06/2018 10:04 AM EST ----- Her total protein is a little reduced, she should examine her diet with her primary care and make sure she is getting enough calories and protein. Labs are otherwise unremarkable, the slight variances in some labs should not affect patient (such as elevated glucose (125) as she may no thave been fasting when we took the lab). Still pending 2 labs and will call if abnormal thanks

## 2018-11-15 ENCOUNTER — Encounter: Payer: Self-pay | Admitting: Neurology

## 2018-11-26 HISTORY — PX: OTHER SURGICAL HISTORY: SHX169

## 2018-11-28 ENCOUNTER — Ambulatory Visit: Payer: Medicare Other | Admitting: Family Medicine

## 2018-12-03 MED ORDER — GENERIC EXTERNAL MEDICATION
Status: DC
Start: 2018-12-03 — End: 2018-12-03

## 2018-12-03 MED ORDER — ACETAMINOPHEN 325 MG PO TABS
650.00 | ORAL_TABLET | ORAL | Status: DC
Start: ? — End: 2018-12-03

## 2018-12-03 MED ORDER — VITAMIN D (ERGOCALCIFEROL) 1.25 MG (50000 UT) PO CAPS
50000.00 | ORAL_CAPSULE | ORAL | Status: DC
Start: 2018-12-06 — End: 2018-12-03

## 2018-12-03 MED ORDER — ONDANSETRON HCL 4 MG/2ML IJ SOLN
4.00 | INTRAMUSCULAR | Status: DC
Start: ? — End: 2018-12-03

## 2018-12-03 MED ORDER — PANTOPRAZOLE SODIUM 40 MG PO TBEC
40.00 | DELAYED_RELEASE_TABLET | ORAL | Status: DC
Start: 2018-12-04 — End: 2018-12-03

## 2018-12-03 MED ORDER — POLYETHYLENE GLYCOL 3350 17 G PO PACK
17.00 | PACK | ORAL | Status: DC
Start: 2018-12-04 — End: 2018-12-03

## 2018-12-03 MED ORDER — HYDROCODONE-ACETAMINOPHEN 7.5-325 MG/15ML PO SOLN
10.00 | ORAL | Status: DC
Start: ? — End: 2018-12-03

## 2018-12-03 MED ORDER — AMITRIPTYLINE HCL 25 MG PO TABS
25.00 | ORAL_TABLET | ORAL | Status: DC
Start: 2018-12-03 — End: 2018-12-03

## 2018-12-04 ENCOUNTER — Telehealth: Payer: Self-pay

## 2018-12-04 DIAGNOSIS — Z9181 History of falling: Secondary | ICD-10-CM | POA: Diagnosis not present

## 2018-12-04 DIAGNOSIS — G4730 Sleep apnea, unspecified: Secondary | ICD-10-CM | POA: Diagnosis not present

## 2018-12-04 DIAGNOSIS — Z9884 Bariatric surgery status: Secondary | ICD-10-CM | POA: Diagnosis not present

## 2018-12-04 DIAGNOSIS — K219 Gastro-esophageal reflux disease without esophagitis: Secondary | ICD-10-CM | POA: Diagnosis not present

## 2018-12-04 DIAGNOSIS — Z6821 Body mass index (BMI) 21.0-21.9, adult: Secondary | ICD-10-CM | POA: Diagnosis not present

## 2018-12-04 DIAGNOSIS — E43 Unspecified severe protein-calorie malnutrition: Secondary | ICD-10-CM | POA: Diagnosis not present

## 2018-12-04 DIAGNOSIS — Z431 Encounter for attention to gastrostomy: Secondary | ICD-10-CM | POA: Diagnosis not present

## 2018-12-04 DIAGNOSIS — R634 Abnormal weight loss: Secondary | ICD-10-CM | POA: Diagnosis not present

## 2018-12-04 NOTE — Telephone Encounter (Signed)
Carrie Shannon is receiving [place] after discharge from the hospital.

## 2018-12-07 ENCOUNTER — Encounter: Payer: Self-pay | Admitting: Gastroenterology

## 2018-12-07 ENCOUNTER — Ambulatory Visit (INDEPENDENT_AMBULATORY_CARE_PROVIDER_SITE_OTHER): Payer: Medicare Other | Admitting: Gastroenterology

## 2018-12-07 VITALS — BP 106/68 | HR 99 | Ht 59.75 in | Wt 106.1 lb

## 2018-12-07 DIAGNOSIS — K289 Gastrojejunal ulcer, unspecified as acute or chronic, without hemorrhage or perforation: Secondary | ICD-10-CM

## 2018-12-07 DIAGNOSIS — K449 Diaphragmatic hernia without obstruction or gangrene: Secondary | ICD-10-CM

## 2018-12-07 NOTE — Patient Instructions (Signed)
If you are age 74 or older, your body mass index should be between 23-30. Your Body mass index is 20.9 kg/m. If this is out of the aforementioned range listed, please consider follow up with your Primary Care Provider.  If you are age 73 or younger, your body mass index should be between 19-25. Your Body mass index is 20.9 kg/m. If this is out of the aformentioned range listed, please consider follow up with your Primary Care Provider.   Please call our office at [phone] to set up your follow up office visit as needed.  It was a pleasure to see you today!  Vito Cirigliano, D.O.

## 2018-12-07 NOTE — Progress Notes (Signed)
P  Chief Complaint:    Marginal ulcer, postop follow-up  GI History: 73 year old female initially seen by me on 10/16/2022 persistent abdominal pain secondary to marginal ulcer.  She had been having upper abdominal pain for multiple years, lasting throughout the day and often debilitating to the point of not wanting to get out of bed.  Pain had been worsening.  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  Ulcer persisted despite Protonix 40 mg BID, Carafate 1 g QID, Zantac, misoprostol QID, and amitriptyline for the pain.  Plan for repeat EGD in early January to reassess marginal ulcer, possible anastomotic stricture, and to remove any exposed surgical staples that could be serving as an ongoing nidus.  Added Prevacid Solutabs and Pepcid along with continued misoprostol and Carafate.  Repeat EGD notable for 2 large marginal ulcers and anastomotic stenosis.  Biopsies were negative for H. pylori.  Interestingly the larger of the 2 ulcers blood less than expected despite biopsies, suspicious for some degree of ischemia.  She was referred back to Dr. Toney Rakes.  PIC placed on 10/30/2018 for TPN and on 11/26/2018 had gastric bypass revision and G-tube placement and lap cruralplasty.  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  No report available -2013: EGD.  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  Refer to Bariatric Surgery for nonhealing ulcer and possible partial obstruction at the outlet of the anastomosis - 04/2018: Seen by Dr. Evorn Gong who recommend referral to tertiary care center - 04/2018: Seen by Dr. Toney Rakes at [facility] to discuss possible surgical options.  Plan was to obtain her previous UGI series and  follow-up.  Gastrin elevated at 151. - CT abdomen/pelvis 08/2018 without acute intra-abdominal pathology, compression deformity of T10  Endoscopic history: Colonoscopy (2009): No report available EGD (2012): No report available EGD (2013): No report available Colonoscopy (12/2016): Diverticulosis EGD (12/2016): Anastomotic ulcer EGD (03/2018): Anastomotic ulcer, hiatal hernia EGD (10/2018, Dr. Bryan Lemma): 3 cm hiatal hernia, large marginal ulcers, measuring 10 mm and 15 mm in size with mild stenosis of the anastomosis.  Biopsies negative for H. pylori.  Interestingly, the larger of the 2 ulcers blood less than expected with forceps, concerning for ischemia.  Erythematous gastric body, biopsies negative for H. pylori.  HPI:     Patient is a 73 y.o. female presenting to the Gastroenterology Clinic for follow-up.  She was initially seen by me in early January for ongoing pain with persistent marginal ulcers.  Expedited EGD demonstrated large marginal ulcers as outlined above and she was referred back to Dr. Toney Rakes and has subsequently had gastric bypass revision, G-tube placement, and lap crural plasty.  Today she states she is much improved since the surgery.  Starting to tolerate p.o. intake.  4 cans via G-tube daily, with plan to reduce next week per Nutrition recommendations.  Otherwise, she is completely pain-free and very grateful for the care provided by this clinic and Dr. Toney Rakes and his staff.  She is essentially without any complaints today.  Review of systems:     No chest pain, no SOB, no fevers, no urinary sx   Past Medical History:  Diagnosis Date  . AC (acromioclavicular) joint bone spurs    heels  . Anemia   .  Depression   . Elbow fracture, right   . Esophageal ring   . GERD (gastroesophageal reflux disease)   . Heart murmur   . Hiatal hernia    EGD 10-27-10/[facility]  . Shoulder fracture, right   . Wrist fracture, right     Patient's surgical  history, family medical history, social history, medications and allergies were all reviewed in Epic    Current Outpatient Medications  Medication Sig Dispense Refill  . AMBULATORY NON FORMULARY MEDICATION Medication Name: Rolator (Patient not taking: Reported on 12/07/2018) 1 each 0  . amitriptyline (ELAVIL) 25 MG tablet Take 1 tablet (25 mg total) by mouth at bedtime. (Patient not taking: Reported on 12/07/2018) 90 tablet 3  . famotidine (PEPCID) 20 MG tablet Take 1 tablet (20 mg total) by mouth 2 (two) times daily. (Patient not taking: Reported on 12/07/2018) 30 tablet 1  . lansoprazole (PREVACID) 30 MG capsule Take 1 capsule (30 mg total) by mouth 2 (two) times daily before a meal. (Patient not taking: Reported on 12/07/2018) 180 capsule 3  . misoprostol (CYTOTEC) 100 MCG tablet Take 1 tablet (100 mcg total) by mouth 4 (four) times daily. (Patient not taking: Reported on 10/31/2018) 120 tablet 1  . ondansetron (ZOFRAN) 4 MG tablet Take 4 mg by mouth every 8 (eight) hours as needed.  1  . sucralfate (CARAFATE) 1 g tablet Take 1 tablet (1 g total) by mouth 4 (four) times daily -  with meals and at bedtime. (Patient not taking: Reported on 12/07/2018) 120 tablet 2   Current Facility-Administered Medications  Medication Dose Route Frequency Provider Last Rate Last Dose  . 0.9 %  sodium chloride infusion  500 mL Intravenous Once Carrie Want V, DO        Physical Exam:     BP 106/68   Pulse 99   Ht 4' 11.75" (1.518 m)   Wt 106 lb 2 oz (48.1 kg)   BMI 20.90 kg/m   GENERAL:  Pleasant female in NAD PSYCH: : Cooperative, normal affect EENT:  conjunctiva pink, mucous membranes moist, neck supple without masses CARDIAC:  RRR, no murmur heard, no peripheral edema PULM: Normal respiratory effort, lungs CTA bilaterally, no wheezing ABDOMEN: Mic-Key low-profile PEG in place, freely mobile 360 degrees.  No exudate, bleeding, skin irritation.  Abdomen otherwise nondistended, soft, nontender. No  obvious masses, no hepatomegaly,  normal bowel sounds SKIN:  turgor, no lesions seen.  Well-healed recent PICC site in right upper torso Musculoskeletal:  Normal muscle tone, normal strength NEURO: Alert and oriented x 3, no focal neurologic deficits   IMPRESSION and PLAN:    #1.  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  She is doing quite well postop, tolerating p.o. intake.  No abdominal pain since the surgery.  Very grateful to Dr. Toney Rakes and his staff for his excellent and expedited care of Carrie Shannon!     #2.  History of Hiatal Hernia: 3 cm hernia noted on recent EGD.  This is since been corrected with crural plasty at time of surgery as above.  She has had no breakthrough reflux symptoms since that surgery.  Tolerating all p.o. intake without any complaint of dysphagia.  No further evaluation or intervention needed at this time.  Unless needed by her surgeon as part of a postop recovery (which she and her husband indicated is not needed at this  time), I am okay with acid suppression therapy on an as-needed basis only at this time.  RTC as needed.     I spent a total of 15 minutes of face-to-face time with the patient. Greater than 50% of the time was spent counseling and coordinating care.   [place] ,DO, FACG 12/07/2018, 9:10 AM

## 2018-12-10 ENCOUNTER — Ambulatory Visit (INDEPENDENT_AMBULATORY_CARE_PROVIDER_SITE_OTHER): Payer: Medicare Other | Admitting: Family Medicine

## 2018-12-10 ENCOUNTER — Encounter: Payer: Self-pay | Admitting: Family Medicine

## 2018-12-10 VITALS — BP 124/76 | HR 110 | Temp 97.8°F | Ht 59.75 in | Wt 104.9 lb

## 2018-12-10 DIAGNOSIS — R630 Anorexia: Secondary | ICD-10-CM

## 2018-12-10 DIAGNOSIS — R635 Abnormal weight gain: Secondary | ICD-10-CM

## 2018-12-10 DIAGNOSIS — Z9884 Bariatric surgery status: Secondary | ICD-10-CM | POA: Diagnosis not present

## 2018-12-10 DIAGNOSIS — Z8639 Personal history of other endocrine, nutritional and metabolic disease: Secondary | ICD-10-CM

## 2018-12-10 DIAGNOSIS — E559 Vitamin D deficiency, unspecified: Secondary | ICD-10-CM | POA: Diagnosis not present

## 2018-12-10 DIAGNOSIS — E440 Moderate protein-calorie malnutrition: Secondary | ICD-10-CM

## 2018-12-10 DIAGNOSIS — K257 Chronic gastric ulcer without hemorrhage or perforation: Secondary | ICD-10-CM | POA: Diagnosis not present

## 2018-12-10 NOTE — Progress Notes (Signed)
Established Patient Office Visit -spittle follow-up.  Subjective:  Patient ID: Carrie Shannon, female    DOB: Jan 29, 1946  Age: 73 y.o. MRN: [id number]  CC:  Chief Complaint  Patient presents with  . Hospitalization Follow-up    HPI Carrie Shannon presents for hospital follow-up.  She is a 73 year old female who underwent a gastric bypass revision because of long history of marginal ulcers at the anastomotic site.  She had initially undergone gastric bypass in 2003 and for the last 10 years had had persistent pain.  She had an endoscopy which confirmed the ulcers and was then evaluated by her previous surgeon Dr. Toney Rakes.  She was hospitalized for 28 days and had a PICC line placed as well as TPN for nutritional support for severe protein caloric calorie malnutrition. She has been geting 4 cans of parenteral nutrition and now is up to eating about 300- 400 calories per day.  She still just really struggling with poor appetite.  She is also had a little more difficulty in moving her bowels these last few days.  She did take a dose of MiraLAX last night to help she has not had a bowel movement yet.  She denies any significant nausea or abdominal pain in fact she stopped all of her medications.  She was also told that she had vitamin D deficiency in the 90s sent in a prescription for the vitamin D 50,000 once a week.  They Are Going to Go Pick It up Today They Had Some Trouble at the Pharmacy Initially.  She has been taking her multivitamins daily per her report there has been reports she is really only taken it a few times.  Past Medical History:  Diagnosis Date  . AC (acromioclavicular) joint bone spurs    heels  . Anemia   . Depression   . Elbow fracture, right   . Esophageal ring   . GERD (gastroesophageal reflux disease)   . Heart murmur   . Hiatal hernia    EGD 10-27-10/[facility]  . Shoulder fracture, right   . Wrist fracture, right     Past Surgical  History:  Procedure Laterality Date  . ABDOMINAL HYSTERECTOMY  1980's    Fibroids  . CHOLECYSTECTOMY  2009  . COLONOSCOPY    . COLONOSCOPY WITH ESOPHAGOGASTRODUODENOSCOPY (EGD)  12/21/2016  . ESOPHAGOGASTRODUODENOSCOPY  03/16/2018   [facility]  . GASTRIC BYPASS  2003  . TUBAL LIGATION  1975  . Ulcer revision  11/26/2018   removed the ulcers  . UPPER GASTROINTESTINAL ENDOSCOPY      Family History  Problem Relation Age of Onset  . Cancer Father        esophageal and lung cancer. Said it spread all over  . Heart attack Mother   . Stroke Mother 40  . Colon cancer Neg Hx   . Rectal cancer Neg Hx   . Stomach cancer Neg Hx     Social History   Socioeconomic History  . Marital status: Married    Spouse name: Carrie Shannon  . Number of children: 3  . Years of education: 8  . Highest education level: 12th grade  Occupational History  . Occupation: Retired Investment banker, operational work    Comment: town Scientist, clinical (histocompatibility and immunogenetics) for [employer]  . Financial resource strain: Not on file  . Food insecurity:    Worry: Not on file    Inability: Not on file  . Transportation needs:    Medical: Not  on file    Non-medical: Not on file  Tobacco Use  . Smoking status: Never Smoker  . Smokeless tobacco: Never Used  Substance and Sexual Activity  . Alcohol use: No  . Drug use: No  . 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 Physical activity:    Days per week: Not on file    Minutes per session: Not on file  . Stress: Not on file  Relationships  . Social connections:    Talks on phone: Not on file    Gets together: Not on file    Attends religious service: Not on file    Active member of club or organization: Not on file    Attends meetings of clubs or organizations: Not on file    Relationship status: Not on file  . Intimate partner violence:     Fear of current or ex partner: Not on file    Emotionally abused: Not on file    Physically abused: Not on file    Forced sexual activity: Not on file  Other Topics Concern  . Not on file  Social History Narrative  . Not on file    Outpatient Medications Prior to Visit  Medication Sig Dispense Refill  . ondansetron (ZOFRAN) 4 MG tablet Take 4 mg by mouth every 8 (eight) hours as needed.  1  . Vitamin D, Ergocalciferol, (DRISDOL) 1.25 MG (50000 UT) CAPS capsule Take 1 capsule by mouth every 7 (seven) days.    . AMBULATORY NON FORMULARY MEDICATION Medication Name: Rolator (Patient not taking: Reported on 12/07/2018) 1 each 0  . lansoprazole (PREVACID) 30 MG capsule Take 1 capsule (30 mg total) by mouth 2 (two) times daily before a meal. (Patient not taking: Reported on 12/07/2018) 180 capsule 3  . amitriptyline (ELAVIL) 25 MG tablet Take 1 tablet (25 mg total) by mouth at bedtime. (Patient not taking: Reported on 12/07/2018) 90 tablet 3  . famotidine (PEPCID) 20 MG tablet Take 1 tablet (20 mg total) by mouth 2 (two) times daily. (Patient not taking: Reported on 12/07/2018) 30 tablet 1  . misoprostol (CYTOTEC) 100 MCG tablet Take 1 tablet (100 mcg total) by mouth 4 (four) times daily. (Patient not taking: Reported on 10/31/2018) 120 tablet 1  . sucralfate (CARAFATE) 1 g tablet Take 1 tablet (1 g total) by mouth 4 (four) times daily -  with meals and at bedtime. (Patient not taking: Reported on 12/07/2018) 120 tablet 2   Facility-Administered Medications Prior to Visit  Medication Dose Route Frequency Provider Last Rate Last Dose  . 0.9 %  sodium chloride infusion  500 mL Intravenous Once Cirigliano, Vito V, DO        Allergies  Allergen Reactions  . Doxepin Other (See Comments)    Headaches  Other reaction(s): Other Headaches Headaches   . Other Rash  . Tape Rash    Band aids    ROS Review of Systems    Objective:    Physical Exam  Constitutional: She is oriented to person,  place, and time. She appears well-developed and well-nourished.  appears pale.   HENT:  Head: Normocephalic and atraumatic.  Cardiovascular: Normal rate, regular rhythm and normal heart sounds.  Pulmonary/Chest: Effort normal and breath sounds normal.  Abdominal:  Area around her port on her abdomen looks good and clean with no erythema or induration.  Neurological: She is alert and oriented to person, place, and time.  Skin: Skin is warm and dry.  Psychiatric: She has a normal mood and affect. Her behavior is normal.    BP 124/76   Pulse (!) 110   Temp 97.8 F (36.6 C) (Oral)   Ht 4' 11.75" (1.518 m)   Wt 104 lb 14.4 oz (47.6 kg)   SpO2 99%   BMI 20.66 kg/m  Wt Readings from Last 3 Encounters:  12/10/18 104 lb 14.4 oz (47.6 kg)  12/07/18 106 lb 2 oz (48.1 kg)  10/31/18 113 lb 3.2 oz (51.3 kg)     Health Maintenance Due  Topic Date Due  . DEXA SCAN  11/15/2018    There are no preventive care reminders to display for this patient.  Lab Results  Component Value Date   TSH 1.370 10/31/2018   Lab Results  Component Value Date   WBC 5.7 10/31/2018   HGB 11.4 10/31/2018   HCT 36.0 10/31/2018   MCV 96 10/31/2018   PLT 349 10/31/2018   Lab Results  Component Value Date   NA 146 (H) 10/31/2018   K 3.9 10/31/2018   CO2 25 10/31/2018   GLUCOSE 125 (H) 10/31/2018   BUN 10 10/31/2018   CREATININE 0.55 (L) 10/31/2018   BILITOT 0.6 10/31/2018   ALKPHOS 90 10/31/2018   AST 28 10/31/2018   ALT 28 10/31/2018   PROT 4.8 (L) 10/31/2018   ALBUMIN 2.5 (L) 10/31/2018   CALCIUM 8.3 (L) 10/31/2018   Lab Results  Component Value Date   CHOL 165 11/01/2016   Lab Results  Component Value Date   HDL 101 11/01/2016   Lab Results  Component Value Date   LDLCALC 44 11/01/2016   Lab Results  Component Value Date   TRIG 100 11/01/2016   Lab Results  Component Value Date   CHOLHDL 1.6 11/01/2016   Lab Results  Component Value Date   HGBA1C 4.8 10/31/2018       Assessment & Plan:   Problem List Items Addressed This Visit      Digestive   Chronic gastric ulcer   Relevant Orders   CBC   COMPLETE METABOLIC PANEL WITH GFR   VITAMIN D 25 Hydroxy (Vit-D Deficiency, Fractures)   Folate   IBC panel   Magnesium   B12     Other   Vitamin D deficiency   BARIATRIC SURGERY STATUS   Relevant Orders   CBC   COMPLETE METABOLIC PANEL WITH GFR   VITAMIN D 25 Hydroxy (Vit-D Deficiency, Fractures)   Folate   IBC panel   Magnesium   B12    Other Visit Diagnoses    Moderate protein-calorie malnutrition ([place])    -  Primary   Relevant Orders   CBC   COMPLETE METABOLIC PANEL WITH GFR   VITAMIN D 25 Hydroxy (Vit-D Deficiency, Fractures)   Folate   IBC panel   Magnesium   B12   Anorexia       Abnormal weight gain       History of non anemic vitamin B12 deficiency           Chronic gastric ulcer s/p bypass revision-she asked actually doing much better her pain is under good control and she is not having any significant nausea now she just needs to really work on getting her caloric intake up so that she can get off the parenteral feeds.  I would like to check her labs today just  to make sure electrolytes etc. are normal.  She reports she is had a little bit of shakiness in her legs.  I do think she should probably still continue the Prevacid at least once daily as she had stopped all of her medications.  I will double check with Dr. Bryan Lemma to see if he feels like she should probably at least continue with the PPI for now.  Even though she is doing well.  Check a CBC.  Vitamin D deficiency-she is going to pick up the vitamin D to 50,000 IU once weekly capsule and go ahead and start back.  History of B12 deficiency will recheck her levels and consider injections since she does not do well with pills if her levels are still low.  Abnormal weight loss-she is now down to 104 pounds she was 113 January 22.  I do 1 to make sure that nutritionally she  is maintaining her weight her husband feels like she has at least been steady in her weight over the last week which is good so we will keep an eye on this I like to see her back in about 3 to 4 weeks to make sure.   No orders of the defined types were placed in this encounter.   Follow-up: Return in about 6 weeks (around 01/21/2019) for Weight check up .    Beatrice Lecher, MD

## 2018-12-11 DIAGNOSIS — Z431 Encounter for attention to gastrostomy: Secondary | ICD-10-CM | POA: Diagnosis not present

## 2018-12-11 DIAGNOSIS — K219 Gastro-esophageal reflux disease without esophagitis: Secondary | ICD-10-CM | POA: Diagnosis not present

## 2018-12-11 DIAGNOSIS — G4730 Sleep apnea, unspecified: Secondary | ICD-10-CM | POA: Diagnosis not present

## 2018-12-11 DIAGNOSIS — R634 Abnormal weight loss: Secondary | ICD-10-CM | POA: Diagnosis not present

## 2018-12-11 DIAGNOSIS — Z6821 Body mass index (BMI) 21.0-21.9, adult: Secondary | ICD-10-CM | POA: Diagnosis not present

## 2018-12-11 DIAGNOSIS — E43 Unspecified severe protein-calorie malnutrition: Secondary | ICD-10-CM | POA: Diagnosis not present

## 2018-12-11 LAB — COMPLETE METABOLIC PANEL WITH GFR
AG Ratio: 1.2 (calc) (ref 1.0–2.5)
ALKALINE PHOSPHATASE (APISO): 94 U/L (ref 37–153)
ALT: 15 U/L (ref 6–29)
AST: 19 U/L (ref 10–35)
Albumin: 3.8 g/dL (ref 3.6–5.1)
BUN/Creatinine Ratio: 31 (calc) — ABNORMAL HIGH (ref 6–22)
BUN: 16 mg/dL (ref 7–25)
CO2: 26 mmol/L (ref 20–32)
Calcium: 9.5 mg/dL (ref 8.6–10.4)
Chloride: 101 mmol/L (ref 98–110)
Creat: 0.52 mg/dL — ABNORMAL LOW (ref 0.60–0.93)
GFR, Est African American: 110 mL/min/{1.73_m2} (ref >=60)
GFR, Est Non African American: 95 mL/min/{1.73_m2} (ref >=60)
GLUCOSE: 119 mg/dL — AB (ref 65–99)
Globulin: 3.2 g/dL (calc) (ref 1.9–3.7)
Potassium: 4.8 mmol/L (ref 3.5–5.3)
Sodium: 137 mmol/L (ref 135–146)
Total Bilirubin: 0.3 mg/dL (ref 0.2–1.2)
Total Protein: 7 g/dL (ref 6.1–8.1)

## 2018-12-11 LAB — CBC
HCT: 32.3 % — ABNORMAL LOW (ref 35.0–45.0)
Hemoglobin: 10.1 g/dL — ABNORMAL LOW (ref 11.7–15.5)
MCH: 28.7 pg (ref 27.0–33.0)
MCHC: 31.3 g/dL — ABNORMAL LOW (ref 32.0–36.0)
MCV: 91.8 fL (ref 80.0–100.0)
MPV: 10.1 fL (ref 7.5–12.5)
Platelets: 644 10*3/uL — ABNORMAL HIGH (ref 140–400)
RBC: 3.52 10*6/uL — ABNORMAL LOW (ref 3.80–5.10)
RDW: 12.8 % (ref 11.0–15.0)
WBC: 10.4 10*3/uL (ref 3.8–10.8)

## 2018-12-11 LAB — VITAMIN D 25 HYDROXY (VIT D DEFICIENCY, FRACTURES): VIT D 25 HYDROXY: 36 ng/mL (ref 30–100)

## 2018-12-11 LAB — VITAMIN B12: Vitamin B-12: 573 pg/mL (ref 200–1100)

## 2018-12-11 LAB — FOLATE: Folate: >24 ng/mL

## 2018-12-11 LAB — MAGNESIUM: Magnesium: 2 mg/dL (ref 1.5–2.5)

## 2018-12-12 ENCOUNTER — Emergency Department (INDEPENDENT_AMBULATORY_CARE_PROVIDER_SITE_OTHER)
Admission: EM | Admit: 2018-12-12 | Discharge: 2018-12-12 | Disposition: A | Discharge status: Home / Self Care | Payer: Medicare Other | Source: Home / Self Care

## 2018-12-12 ENCOUNTER — Other Ambulatory Visit: Payer: Self-pay

## 2018-12-12 ENCOUNTER — Emergency Department (INDEPENDENT_AMBULATORY_CARE_PROVIDER_SITE_OTHER): Payer: Medicare Other

## 2018-12-12 DIAGNOSIS — S299XXA Unspecified injury of thorax, initial encounter: Secondary | ICD-10-CM | POA: Diagnosis not present

## 2018-12-12 DIAGNOSIS — R0781 Pleurodynia: Secondary | ICD-10-CM

## 2018-12-12 DIAGNOSIS — S41111A Laceration without foreign body of right upper arm, initial encounter: Secondary | ICD-10-CM

## 2018-12-12 DIAGNOSIS — S2231XA Fracture of one rib, right side, initial encounter for closed fracture: Secondary | ICD-10-CM

## 2018-12-12 MED ORDER — PROMETHAZINE-CODEINE 6.25-10 MG/5ML PO SOLN: 5.0000 mL | Freq: Four times a day (QID) | ORAL | 0 refills | Status: DC | PRN

## 2018-12-12 NOTE — ED Provider Notes (Addendum)
[name]    CSN: [id number] Arrival date & time: 12/12/18  1241     History   Chief Complaint Chief Complaint  Patient presents with  . Fall  . Back Pain    between shoulders    HPI Carrie Shannon is a 73 y.o. female.   Reports falling on ground hit head and back ~ 1.5 hours ago while chasing cat c/o bilateral thoracic back pain.  She also tore the skin on her right forearm     Past Medical History:  Diagnosis Date  . AC (acromioclavicular) joint bone spurs    heels  . Anemia   . Depression   . Elbow fracture, right   . Esophageal ring   . GERD (gastroesophageal reflux disease)   . Heart murmur   . Hiatal hernia    EGD 10-27-10/[facility]  . Shoulder fracture, right   . Wrist fracture, right     Patient Active Problem List   Diagnosis Date Noted  . Hypocalcemia 10/23/2018  . Hypoalbuminemia 10/23/2018  . Muscle weakness (generalized) 10/23/2018  . Swelling of extremity 10/23/2018  . Fatty liver 10/23/2018  . Left foot drop 09/20/2018  . Left arm weakness 09/20/2018  . Heart murmur 09/20/2018  . Syncope 09/20/2018  . Abnormal weight loss 09/20/2018  . Chronic gastric ulcer 09/20/2018  . Osteoarthritis of left knee 04/26/2013  . Left lumbar radiculitis 04/26/2013  . 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 Vitamin D deficiency 11/24/2010  . Disorder of bone and cartilage 11/24/2010  . ANEMIA, PERNICIOUS 10/26/2010  . BARIATRIC SURGERY STATUS 10/20/2010    Past Surgical History:  Procedure Laterality Date  . ABDOMINAL HYSTERECTOMY  1980's    Fibroids  . CHOLECYSTECTOMY  2009  . COLONOSCOPY    . COLONOSCOPY WITH ESOPHAGOGASTRODUODENOSCOPY (EGD)  12/21/2016  . ESOPHAGOGASTRODUODENOSCOPY  03/16/2018   [facility]. [facility] [place]  . GASTRIC BYPASS  2003  . TUBAL LIGATION  1975  . Ulcer revision  11/26/2018   removed the ulcers  . UPPER GASTROINTESTINAL ENDOSCOPY      OB History   No obstetric  history on file.      Home Medications    Prior to Admission medications   Medication Sig Start Date End Date Taking? Authorizing Provider  ondansetron (ZOFRAN) 4 MG tablet Take 4 mg by mouth every 8 (eight) hours as needed. 05/14/18   [provider]  Promethazine-Codeine 6.25-10 MG/5ML SOLN Take 5 mLs by mouth every 6 (six) hours as needed. 12/12/18   Robyn Haber, MD  Vitamin D, Ergocalciferol, (DRISDOL) 1.25 MG (50000 UT) CAPS capsule Take 1 capsule by mouth every 7 (seven) days. 12/06/18 02/26/19  [provider]    Family History Family History  Problem Relation Age of Onset  . Cancer Father        esophageal and lung cancer. Said it spread all over  . Heart attack Mother   . Stroke Mother 2  . Colon cancer Neg Hx   . Rectal cancer Neg Hx   . Stomach cancer Neg Hx     Social History Social History   Tobacco Use  . Smoking status: Never Smoker  . Smokeless tobacco: Never Used  Substance Use Topics  . Alcohol use: No  . Drug use: No     Allergies   Doxepin; Other; and Tape   Review of Systems Review of Systems  Gastrointestinal: Negative for abdominal pain.  Musculoskeletal: Positive for back pain. Negative for neck pain  and neck stiffness.  Skin: Positive for wound.  Neurological: Negative for syncope, facial asymmetry and numbness.       Negative LOC     Physical Exam Triage Vital Signs ED Triage Vitals  Enc Vitals Group     BP 12/12/18 1347 118/79     Pulse Rate 12/12/18 1347 (!) 108     Resp --      Temp --      Temp src --      SpO2 12/12/18 1347 100 %     Weight 12/12/18 1349 103 lb 9.9 oz (47 kg)     Height 12/12/18 1349 4\' 11"  (1.499 m)     Head Circumference --      Peak Flow --      Pain Score 12/12/18 1349 9     Pain Loc --      Pain Edu? --      Excl. in [place]? --    No data found.  Updated Vital Signs BP 118/79 (BP Location: Right Arm)   Pulse (!) 108   Ht 4\' 11"  (1.499 m)   Wt 47 kg   SpO2 100%   BMI 20.93  kg/m    Physical Exam Vitals signs and nursing note reviewed.  Constitutional:      Appearance: Normal appearance.  HENT:     Head: Atraumatic.     Mouth/Throat:     Mouth: Mucous membranes are dry.  Eyes:     Extraocular Movements: Extraocular movements intact.     Conjunctiva/sclera: Conjunctivae normal.     Pupils: Pupils are equal, round, and reactive to light.  Neck:     Musculoskeletal: Normal range of motion and neck supple. No muscular tenderness.  Cardiovascular:     Rate and Rhythm: Tachycardia present.     Pulses: Normal pulses.  Pulmonary:     Effort: Pulmonary effort is normal.     Breath sounds: Normal breath sounds.  Abdominal:     Palpations: Abdomen is soft.  Musculoskeletal:        General: Tenderness present. No swelling.       Arms:     Comments: Pain as noted on diagram that increases with inspiration and palpation R > L posterior ribs in midchest. No palpable deformity or crepitus  Skin:    General: Skin is warm and dry.          Comments: Skin tear  Neurological:     General: No focal deficit present.     Mental Status: She is alert and oriented to person, place, and time.  Psychiatric:        Mood and Affect: Mood normal.        Thought Content: Thought content normal.        Judgment: Judgment normal.      UC Treatments / Results  Labs (all labs ordered are listed, but only abnormal results are displayed) Labs Reviewed - No data to display  EKG None  Radiology Dg Ribs Unilateral W/chest Right  Result Date: 12/12/2018 CLINICAL DATA:  Acute RIGHT rib pain following fall today. Initial encounter. EXAM: RIGHT RIBS AND CHEST - 3+ VIEW COMPARISON:  10/01/2018 MR FINDINGS: No acute rib abnormality noted.  No acute rib fracture identified. Cardiomegaly noted. There is no evidence of focal airspace disease, pulmonary edema, suspicious pulmonary nodule/mass, pleural effusion, or pneumothorax. No acute bony abnormalities are identified. T10  compression fracture is again noted. IMPRESSION: 1. No acute abnormality.  No evidence of  acute rib fracture 2. Cardiomegaly 3. Remote T10 compression fracture Electronically Signed   By: Margarette Canada M.D.   On: 12/12/2018 14:57    Procedures Laceration Repair Date/Time: 12/12/2018 3:10 PM Performed by: Robyn Haber, MD Authorized by: Robyn Haber, MD   Consent:    Consent obtained:  Verbal   Consent given by:  Patient   Risks discussed:  Infection and pain   Alternatives discussed:  No treatment Anesthesia (see MAR for exact dosages):    Anesthesia method:  None Laceration details:    Location:  Shoulder/arm   Shoulder/arm location:  R lower arm Repair type:    Repair type:  Simple Treatment:    Area cleansed with:  Saline   Amount of cleaning:  Standard   Irrigation solution:  Sterile saline   Visualized foreign bodies/material removed: no   Skin repair:    Repair method:  Tissue adhesive Approximation:    Approximation:  Loose Post-procedure details:    Dressing:  Bulky dressing and non-adherent dressing   Patient tolerance of procedure:  Tolerated well, no immediate complications Comments:     The wound edges were gently teased flat and approximated.   (including critical care time)  Medications Ordered in UC Medications - No data to display  Initial Impression / Assessment and Plan / UC Course  I have reviewed the triage vital signs and the nursing notes.  Pertinent labs & imaging results that were available during my care of the patient were reviewed by me and considered in my medical decision making (see chart for details).  Clinical Course as of Dec 12 1510  Wed Dec 12, 2018  1435 DG Ribs Unilateral W/Chest Right [KL]    Clinical Course User Index [KL] Robyn Haber, MD   Final Clinical Impressions(s) / UC Diagnoses   Final diagnoses:  Closed fracture of one rib of right side, initial encounter  Laceration of right upper extremity, initial  encounter     Discharge Instructions     Follow up in 1 week with your doctor  Change dressing daily.  You may shower normally starting tomorrow morning  See instructions on the following pages    ED Prescriptions    Medication Sig Dispense Auth. Provider   Promethazine-Codeine 6.25-10 MG/5ML SOLN Take 5 mLs by mouth every 6 (six) hours as needed. 100 mL Robyn Haber, MD     Controlled Substance Prescriptions [place] Controlled Substance Registry consulted? Yes, I have consulted the  Controlled Substances Registry for this patient, and feel the risk/benefit ratio today is favorable for proceeding with this prescription for a controlled substance.   Robyn Haber, MD 12/12/18 1515    Robyn Haber, MD 12/12/18 Jeri Lager

## 2018-12-12 NOTE — Discharge Instructions (Addendum)
Follow up in 1 week with your doctor  Change dressing daily.  You may shower normally starting tomorrow morning  See instructions on the following pages

## 2018-12-12 NOTE — ED Triage Notes (Signed)
Pt c/o back pain since her fall this morning outside her home. Was chasing her catch when she tripped and fell forward, landing on her head as she says. Denies LOC. No injury shown on head. Has abrasions to RT hand and forearm. Main source of pain she says is in her back between her shoulders. Pain 9/10

## 2018-12-13 ENCOUNTER — Telehealth: Payer: Self-pay | Admitting: Emergency Medicine

## 2018-12-13 NOTE — Telephone Encounter (Signed)
Patient  is doing better, I  wished her well and advised her to call if needed

## 2018-12-19 ENCOUNTER — Telehealth: Payer: Self-pay

## 2018-12-19 DIAGNOSIS — Z6821 Body mass index (BMI) 21.0-21.9, adult: Secondary | ICD-10-CM | POA: Diagnosis not present

## 2018-12-19 DIAGNOSIS — Z431 Encounter for attention to gastrostomy: Secondary | ICD-10-CM | POA: Diagnosis not present

## 2018-12-19 DIAGNOSIS — E43 Unspecified severe protein-calorie malnutrition: Secondary | ICD-10-CM | POA: Diagnosis not present

## 2018-12-19 DIAGNOSIS — K219 Gastro-esophageal reflux disease without esophagitis: Secondary | ICD-10-CM | POA: Diagnosis not present

## 2018-12-19 DIAGNOSIS — R634 Abnormal weight loss: Secondary | ICD-10-CM | POA: Diagnosis not present

## 2018-12-19 DIAGNOSIS — G4730 Sleep apnea, unspecified: Secondary | ICD-10-CM | POA: Diagnosis not present

## 2018-12-19 NOTE — Telephone Encounter (Signed)
Katie from [facility] called stating Carrie Shannon had a fall last Wednesday and injured herself. Carrie Shannon was evaluated and treated in our urgent care.

## 2018-12-20 ENCOUNTER — Encounter: Payer: No Typology Code available for payment source | Admitting: Neurology

## 2019-01-01 DIAGNOSIS — K219 Gastro-esophageal reflux disease without esophagitis: Secondary | ICD-10-CM | POA: Diagnosis not present

## 2019-01-01 DIAGNOSIS — Z431 Encounter for attention to gastrostomy: Secondary | ICD-10-CM | POA: Diagnosis not present

## 2019-01-01 DIAGNOSIS — G4730 Sleep apnea, unspecified: Secondary | ICD-10-CM | POA: Diagnosis not present

## 2019-01-01 DIAGNOSIS — E43 Unspecified severe protein-calorie malnutrition: Secondary | ICD-10-CM | POA: Diagnosis not present

## 2019-01-01 DIAGNOSIS — Z6821 Body mass index (BMI) 21.0-21.9, adult: Secondary | ICD-10-CM | POA: Diagnosis not present

## 2019-01-01 DIAGNOSIS — R634 Abnormal weight loss: Secondary | ICD-10-CM | POA: Diagnosis not present

## 2019-02-18 DIAGNOSIS — R251 Tremor, unspecified: Secondary | ICD-10-CM | POA: Diagnosis not present

## 2019-02-18 DIAGNOSIS — E43 Unspecified severe protein-calorie malnutrition: Secondary | ICD-10-CM | POA: Diagnosis not present

## 2019-02-18 DIAGNOSIS — Z9884 Bariatric surgery status: Secondary | ICD-10-CM | POA: Diagnosis not present

## 2019-02-22 ENCOUNTER — Telehealth: Payer: Self-pay

## 2019-02-22 NOTE — Telephone Encounter (Signed)
Patient called stating she has been having diarrhea for the last 3 days. It started "randomly" and she denies any stomach aches or cramps. She states she has gone through 6 pairs of underwear this morning, she is unable to control it. She states she does not have any other SX and overall feels okay. States she has been doing a "basic" diet of plain foods, trying to increase water intake to keep hydrated. Has not tried anything OTC- wanting to know what she should take/if she needs RX?

## 2019-02-22 NOTE — Telephone Encounter (Signed)
Called and advised patient. She will begin [employer] and work on hydrating. She wants to wait on scheduling appt until she sees how she does this weekend with modifications. She chooses to stay away from Imodium at this time and work on diet. No contaminated nor raw food to her knowledge. She will call ton-call nurse this weekend if ANY change is SX. No further needs at this time

## 2019-02-22 NOTE — Telephone Encounter (Signed)
I would mostly just focus on the brat diet, bananas rice applesauce and toast.  Particularly the rice and the toast.  They can Help bind the stool.  I would just make sure drinking plenty of water and hydrating well she can even use products like Gatorade if needed.  Stay away from dairy or caffeinated foods which can irritate the gut.  If she starts noticing any blood or has a fever or feels like she is getting worse then please let us know immediately.  We do have an on-call nurse through the weekend.  If she still having diarrhea by Monday then let us plan to schedule her for a virtual visit if not improving.  Then we can determine if she may need further testing.  She can use Imodium over-the-counter if needed but sometimes it can prolong an illness by slowing down the diarrhea versus letting things get cleaned out on their own.  Does she think she ate something that was undercooked or contaminated?  I do not have any appointments left today but we could certainly get her on for a virtual visit with 1 of my partners if she would like before the weekend.

## 2019-02-25 ENCOUNTER — Encounter: Payer: Self-pay | Admitting: Family Medicine

## 2019-02-25 DIAGNOSIS — H25813 Combined forms of age-related cataract, bilateral: Secondary | ICD-10-CM | POA: Diagnosis not present

## 2019-02-25 DIAGNOSIS — H52203 Unspecified astigmatism, bilateral: Secondary | ICD-10-CM | POA: Diagnosis not present

## 2019-02-25 DIAGNOSIS — H527 Unspecified disorder of refraction: Secondary | ICD-10-CM | POA: Diagnosis not present

## 2019-02-25 DIAGNOSIS — H43812 Vitreous degeneration, left eye: Secondary | ICD-10-CM | POA: Diagnosis not present

## 2019-03-07 ENCOUNTER — Encounter: Payer: Self-pay | Admitting: Family Medicine

## 2019-03-07 DIAGNOSIS — H25813 Combined forms of age-related cataract, bilateral: Secondary | ICD-10-CM | POA: Diagnosis not present

## 2019-03-07 DIAGNOSIS — R531 Weakness: Secondary | ICD-10-CM

## 2019-03-07 DIAGNOSIS — G1229 Other motor neuron disease: Secondary | ICD-10-CM

## 2019-03-07 DIAGNOSIS — H52203 Unspecified astigmatism, bilateral: Secondary | ICD-10-CM | POA: Diagnosis not present

## 2019-03-09 DIAGNOSIS — Z01812 Encounter for preprocedural laboratory examination: Secondary | ICD-10-CM | POA: Diagnosis not present

## 2019-03-09 DIAGNOSIS — Z1159 Encounter for screening for other viral diseases: Secondary | ICD-10-CM | POA: Diagnosis not present

## 2019-03-09 DIAGNOSIS — H269 Unspecified cataract: Secondary | ICD-10-CM | POA: Diagnosis not present

## 2019-03-11 DIAGNOSIS — H269 Unspecified cataract: Secondary | ICD-10-CM

## 2019-03-11 HISTORY — DX: Unspecified cataract: H26.9

## 2019-03-12 DIAGNOSIS — H43812 Vitreous degeneration, left eye: Secondary | ICD-10-CM | POA: Diagnosis not present

## 2019-03-12 DIAGNOSIS — K219 Gastro-esophageal reflux disease without esophagitis: Secondary | ICD-10-CM | POA: Diagnosis not present

## 2019-03-12 DIAGNOSIS — Z8639 Personal history of other endocrine, nutritional and metabolic disease: Secondary | ICD-10-CM | POA: Diagnosis not present

## 2019-03-12 DIAGNOSIS — H25812 Combined forms of age-related cataract, left eye: Secondary | ICD-10-CM | POA: Diagnosis not present

## 2019-03-12 DIAGNOSIS — H25811 Combined forms of age-related cataract, right eye: Secondary | ICD-10-CM | POA: Diagnosis not present

## 2019-03-12 DIAGNOSIS — Z79899 Other long term (current) drug therapy: Secondary | ICD-10-CM | POA: Diagnosis not present

## 2019-03-12 DIAGNOSIS — Z9884 Bariatric surgery status: Secondary | ICD-10-CM | POA: Diagnosis not present

## 2019-03-12 DIAGNOSIS — I509 Heart failure, unspecified: Secondary | ICD-10-CM | POA: Diagnosis not present

## 2019-03-12 DIAGNOSIS — H269 Unspecified cataract: Secondary | ICD-10-CM | POA: Diagnosis not present

## 2019-03-12 DIAGNOSIS — K279 Peptic ulcer, site unspecified, unspecified as acute or chronic, without hemorrhage or perforation: Secondary | ICD-10-CM | POA: Diagnosis not present

## 2019-03-12 DIAGNOSIS — H52203 Unspecified astigmatism, bilateral: Secondary | ICD-10-CM | POA: Diagnosis not present

## 2019-03-13 ENCOUNTER — Encounter: Payer: Self-pay | Admitting: Family Medicine

## 2019-03-13 DIAGNOSIS — H43812 Vitreous degeneration, left eye: Secondary | ICD-10-CM | POA: Diagnosis not present

## 2019-03-13 DIAGNOSIS — H52203 Unspecified astigmatism, bilateral: Secondary | ICD-10-CM | POA: Diagnosis not present

## 2019-03-13 DIAGNOSIS — H25811 Combined forms of age-related cataract, right eye: Secondary | ICD-10-CM | POA: Diagnosis not present

## 2019-03-13 DIAGNOSIS — Z961 Presence of intraocular lens: Secondary | ICD-10-CM | POA: Diagnosis not present

## 2019-03-13 DIAGNOSIS — H527 Unspecified disorder of refraction: Secondary | ICD-10-CM | POA: Diagnosis not present

## 2019-03-16 DIAGNOSIS — Z01812 Encounter for preprocedural laboratory examination: Secondary | ICD-10-CM | POA: Diagnosis not present

## 2019-03-16 DIAGNOSIS — Z1159 Encounter for screening for other viral diseases: Secondary | ICD-10-CM | POA: Diagnosis not present

## 2019-03-16 DIAGNOSIS — Z79899 Other long term (current) drug therapy: Secondary | ICD-10-CM | POA: Diagnosis not present

## 2019-03-19 DIAGNOSIS — H52203 Unspecified astigmatism, bilateral: Secondary | ICD-10-CM | POA: Diagnosis not present

## 2019-03-19 DIAGNOSIS — H25811 Combined forms of age-related cataract, right eye: Secondary | ICD-10-CM | POA: Diagnosis not present

## 2019-03-19 DIAGNOSIS — Z79899 Other long term (current) drug therapy: Secondary | ICD-10-CM | POA: Diagnosis not present

## 2019-03-19 DIAGNOSIS — H25813 Combined forms of age-related cataract, bilateral: Secondary | ICD-10-CM | POA: Diagnosis not present

## 2019-03-19 DIAGNOSIS — Z9884 Bariatric surgery status: Secondary | ICD-10-CM | POA: Diagnosis not present

## 2019-03-19 DIAGNOSIS — K279 Peptic ulcer, site unspecified, unspecified as acute or chronic, without hemorrhage or perforation: Secondary | ICD-10-CM | POA: Diagnosis not present

## 2019-03-19 DIAGNOSIS — H43812 Vitreous degeneration, left eye: Secondary | ICD-10-CM | POA: Diagnosis not present

## 2019-03-19 DIAGNOSIS — K219 Gastro-esophageal reflux disease without esophagitis: Secondary | ICD-10-CM | POA: Diagnosis not present

## 2019-03-19 DIAGNOSIS — E1136 Type 2 diabetes mellitus with diabetic cataract: Secondary | ICD-10-CM | POA: Diagnosis not present

## 2019-03-19 DIAGNOSIS — H25812 Combined forms of age-related cataract, left eye: Secondary | ICD-10-CM | POA: Diagnosis not present

## 2019-03-20 DIAGNOSIS — H43812 Vitreous degeneration, left eye: Secondary | ICD-10-CM | POA: Diagnosis not present

## 2019-03-20 DIAGNOSIS — Z961 Presence of intraocular lens: Secondary | ICD-10-CM | POA: Diagnosis not present

## 2019-03-20 DIAGNOSIS — H52203 Unspecified astigmatism, bilateral: Secondary | ICD-10-CM | POA: Diagnosis not present

## 2019-03-20 DIAGNOSIS — H527 Unspecified disorder of refraction: Secondary | ICD-10-CM | POA: Diagnosis not present

## 2019-04-01 DIAGNOSIS — R251 Tremor, unspecified: Secondary | ICD-10-CM | POA: Diagnosis not present

## 2019-04-01 DIAGNOSIS — Z9181 History of falling: Secondary | ICD-10-CM | POA: Diagnosis not present

## 2019-04-01 DIAGNOSIS — G20 Parkinson's disease: Secondary | ICD-10-CM | POA: Diagnosis not present

## 2019-04-01 DIAGNOSIS — K289 Gastrojejunal ulcer, unspecified as acute or chronic, without hemorrhage or perforation: Secondary | ICD-10-CM | POA: Diagnosis not present

## 2019-04-01 DIAGNOSIS — Z7409 Other reduced mobility: Secondary | ICD-10-CM | POA: Diagnosis not present

## 2019-04-02 DIAGNOSIS — K289 Gastrojejunal ulcer, unspecified as acute or chronic, without hemorrhage or perforation: Secondary | ICD-10-CM | POA: Diagnosis not present

## 2019-04-02 DIAGNOSIS — Z9884 Bariatric surgery status: Secondary | ICD-10-CM | POA: Diagnosis not present

## 2019-04-02 DIAGNOSIS — R002 Palpitations: Secondary | ICD-10-CM | POA: Diagnosis not present

## 2019-04-02 DIAGNOSIS — R42 Dizziness and giddiness: Secondary | ICD-10-CM | POA: Diagnosis not present

## 2019-04-02 DIAGNOSIS — R55 Syncope and collapse: Secondary | ICD-10-CM | POA: Diagnosis not present

## 2019-04-02 DIAGNOSIS — E559 Vitamin D deficiency, unspecified: Secondary | ICD-10-CM | POA: Diagnosis not present

## 2019-04-02 DIAGNOSIS — Z7409 Other reduced mobility: Secondary | ICD-10-CM | POA: Diagnosis not present

## 2019-04-02 DIAGNOSIS — E43 Unspecified severe protein-calorie malnutrition: Secondary | ICD-10-CM | POA: Diagnosis not present

## 2019-04-02 DIAGNOSIS — Z7689 Persons encountering health services in other specified circumstances: Secondary | ICD-10-CM | POA: Diagnosis not present

## 2019-04-09 DIAGNOSIS — G20 Parkinson's disease: Secondary | ICD-10-CM | POA: Diagnosis not present

## 2019-04-09 DIAGNOSIS — Z7409 Other reduced mobility: Secondary | ICD-10-CM | POA: Diagnosis not present

## 2019-04-09 DIAGNOSIS — R269 Unspecified abnormalities of gait and mobility: Secondary | ICD-10-CM | POA: Diagnosis not present

## 2019-04-09 DIAGNOSIS — Z9181 History of falling: Secondary | ICD-10-CM | POA: Diagnosis not present

## 2019-04-10 DIAGNOSIS — G709 Myoneural disorder, unspecified: Secondary | ICD-10-CM

## 2019-04-10 HISTORY — DX: Myoneural disorder, unspecified: G70.9

## 2019-04-18 ENCOUNTER — Encounter: Payer: Self-pay | Admitting: Family Medicine

## 2019-04-18 DIAGNOSIS — Z9181 History of falling: Secondary | ICD-10-CM | POA: Diagnosis not present

## 2019-04-18 DIAGNOSIS — Z7409 Other reduced mobility: Secondary | ICD-10-CM | POA: Diagnosis not present

## 2019-04-18 DIAGNOSIS — R269 Unspecified abnormalities of gait and mobility: Secondary | ICD-10-CM | POA: Diagnosis not present

## 2019-04-18 DIAGNOSIS — G20 Parkinson's disease: Secondary | ICD-10-CM | POA: Diagnosis not present

## 2019-04-21 IMAGING — MR MR THORACIC SPINE W/O CM
6 series · 43 of 48 positions shown · non-contrast
Comparison: Lumbar radiography 08/20/2018

CLINICAL DATA: Recent falls.  Back pain.  Abnormal imaging.

EXAM:
MRI THORACIC SPINE WITHOUT CONTRAST
TECHNIQUE: Multiplanar, multisequence MR imaging of the thoracic spine was
performed. No intravenous contrast was administered.

[Series 5: T2 · sagittal · 3.0mm · 1.00mm/px · 5 of 15 slices shown (1 of 2)]
[im 1/15]
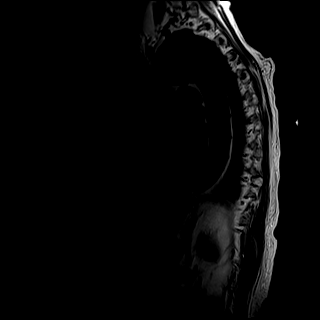
[im 4/15]
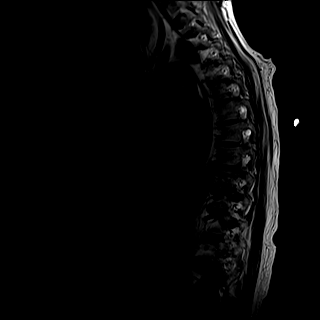
[im 8/15]
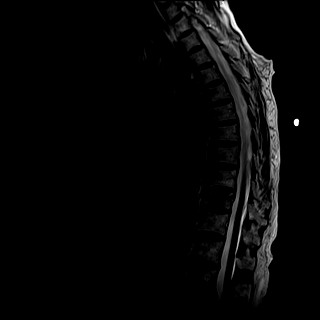
[im 11/15]
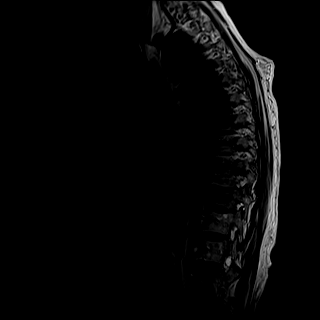
[im 15/15]
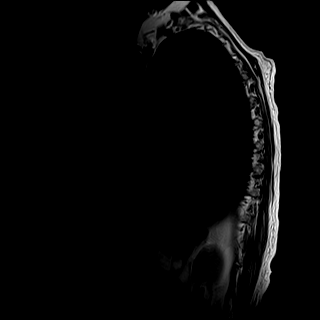

[Series 6: T1 · sagittal · 3.0mm · 1.00mm/px · 5 of 15 slices shown]
[im 1/15]
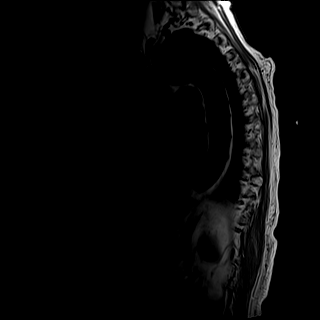
[im 4/15]
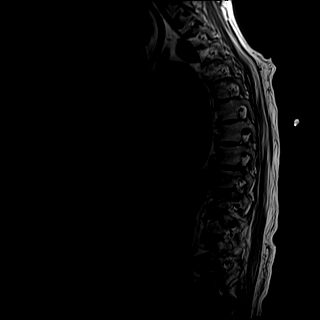
[im 8/15]
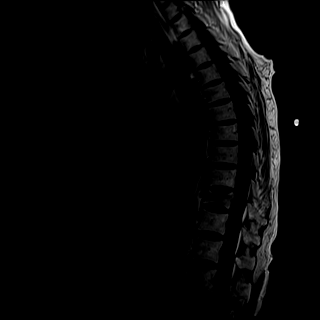
[im 11/15]
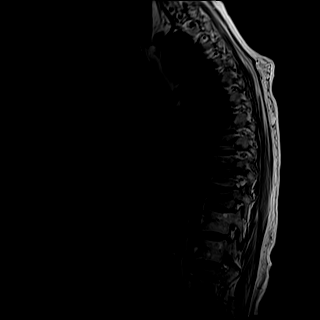
[im 15/15]
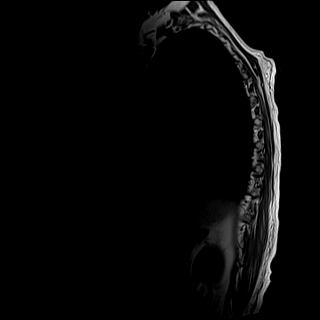

[Series 7: STIR · sagittal · 3.0mm · 1.00mm/px · 5 of 15 slices shown]
[im 1/15]
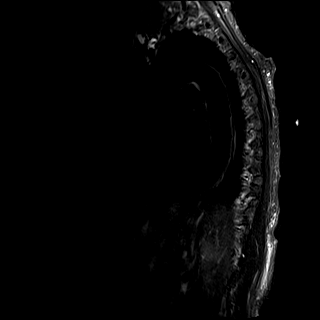
[im 4/15]
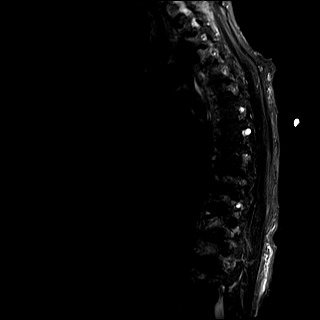
[im 8/15]
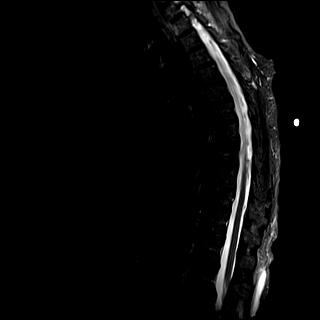
[im 11/15]
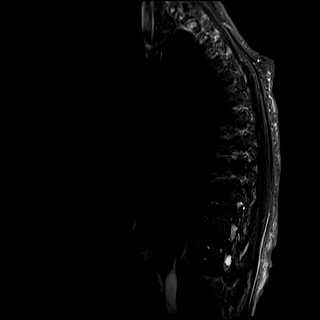
[im 15/15]
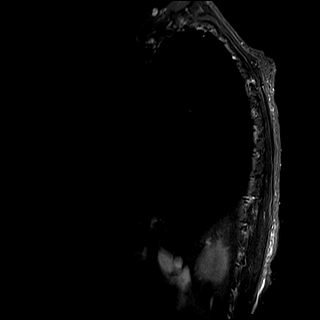

[Series 8: T2 · axial · 5.0mm · 0.86mm/px · z∈[-155,+44]mm · 15 of 40 slices shown (2 of 2)]
[im 1/40]
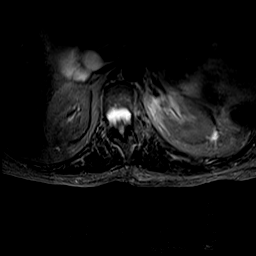
[im 3/40]
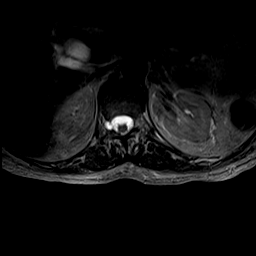
[im 6/40]
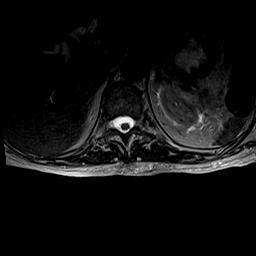
[im 9/40]
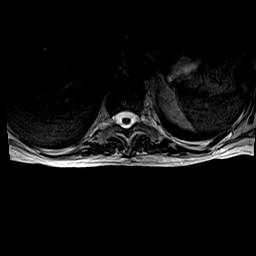
[im 12/40]
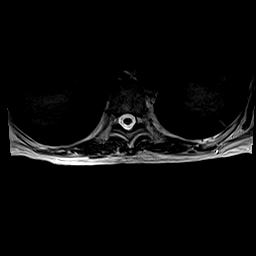
[im 14/40]
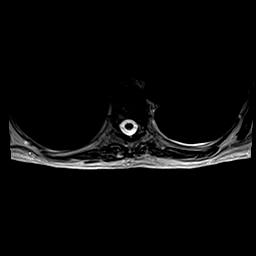
[im 17/40]
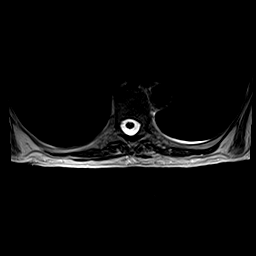
[im 20/40]
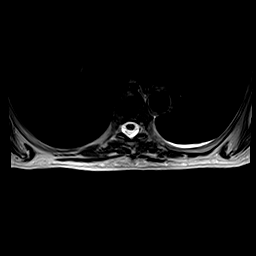
[im 23/40]
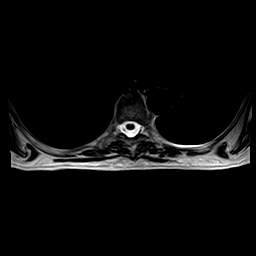
[im 26/40]
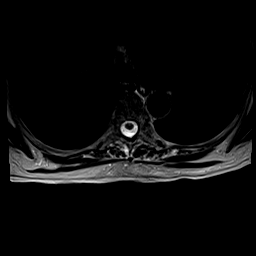
[im 28/40]
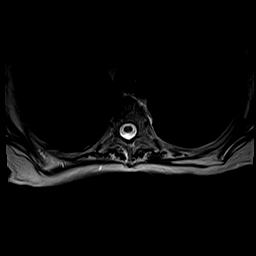
[im 31/40]
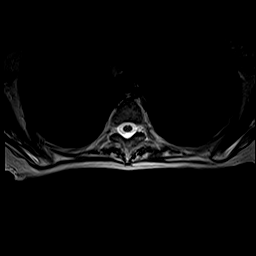
[im 34/40]
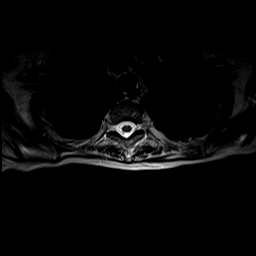
[im 37/40]
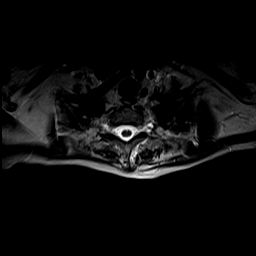
[im 40/40]
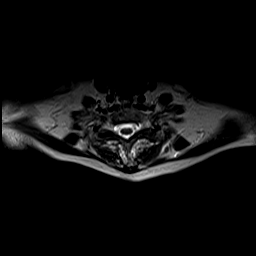

[Series 9: mpgr ax · axial · 5.0mm · 0.35mm/px · z∈[-162,+63]mm · 10 of 40 slices shown]
[im 1/40]
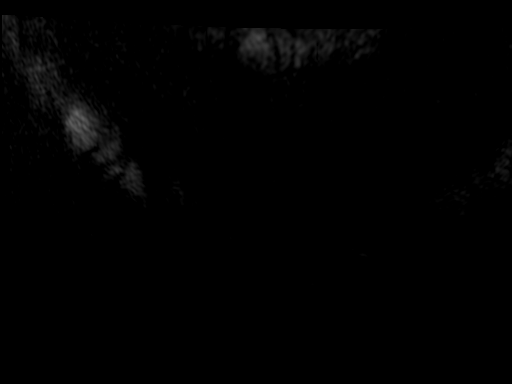
[im 3/40]
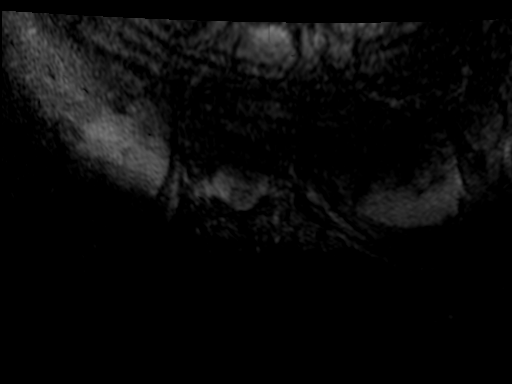
[im 6/40]
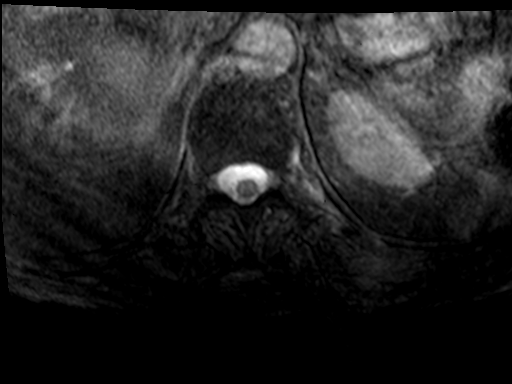
[im 9/40]
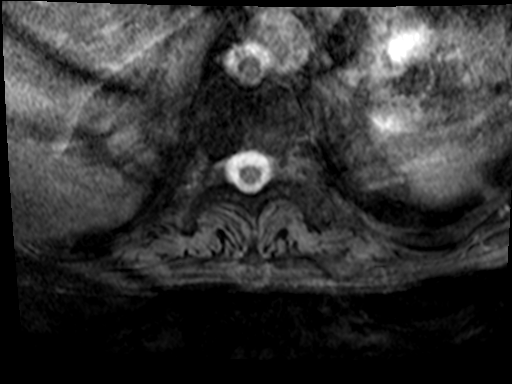
[im 12/40]
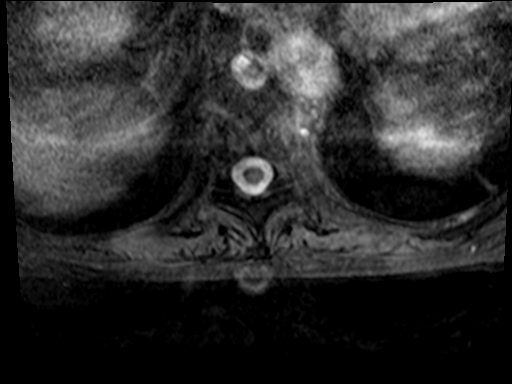
[im 17/40]
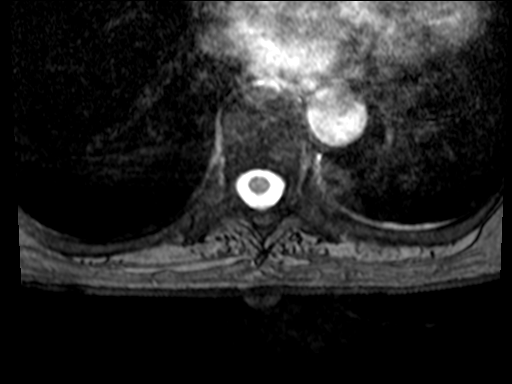
[im 23/40]
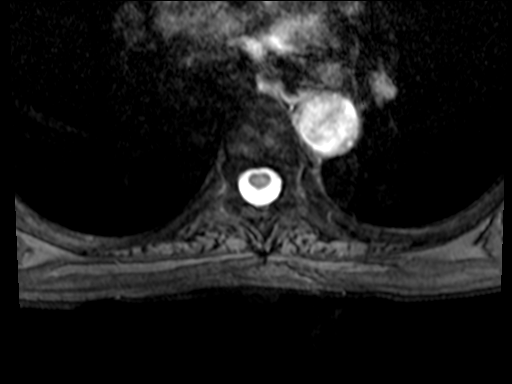
[im 28/40]
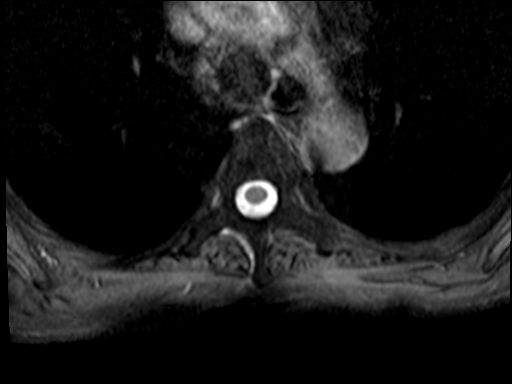
[im 34/40]
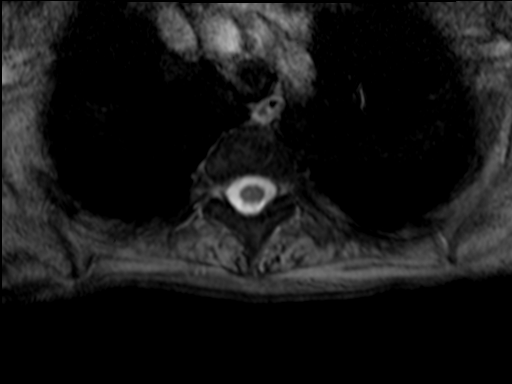
[im 40/40]
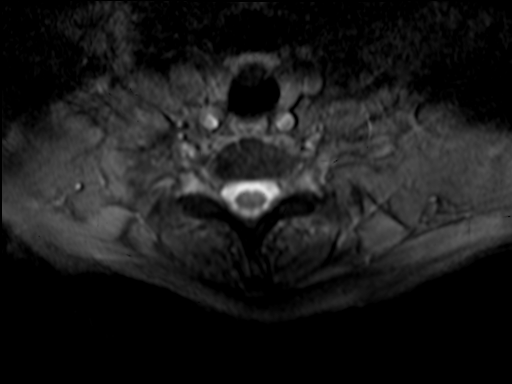

[Series 100: sag counting loc · sagittal · 7.0mm · 1.12mm/px · 3 of 7 slices shown]
[im 1/7]
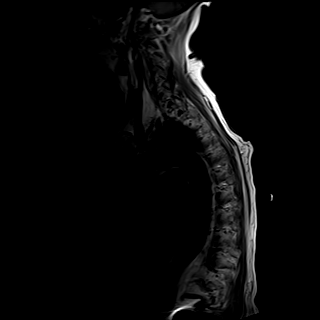
[im 4/7]
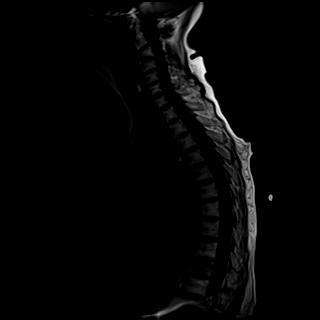
[im 7/7]
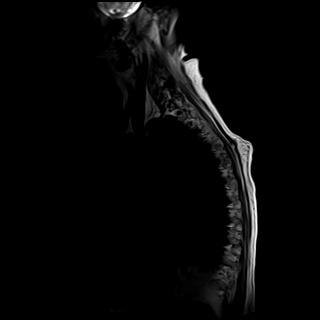

[43 of 48 positions shown; findings below may reference images not displayed]

FINDINGS: Alignment:  Normal

Vertebrae: There is loss of height of the T10 vertebral body of
about 40%. This appears to be an old healed fracture, without any
residual edema. No evidence of recent thoracic injury.

Cord:  Normal

Paraspinal and other soft tissues: Normal

Disc levels:

No disc degeneration, bulge or herniation. No stenosis of the canal
or foramina.
IMPRESSION: The T10 fracture is old and completely healed without residual
edema. No acute or significant thoracic finding.

## 2019-04-23 DIAGNOSIS — G20 Parkinson's disease: Secondary | ICD-10-CM | POA: Diagnosis not present

## 2019-04-23 DIAGNOSIS — Z9181 History of falling: Secondary | ICD-10-CM | POA: Diagnosis not present

## 2019-04-23 DIAGNOSIS — Z7409 Other reduced mobility: Secondary | ICD-10-CM | POA: Diagnosis not present

## 2019-04-23 DIAGNOSIS — R269 Unspecified abnormalities of gait and mobility: Secondary | ICD-10-CM | POA: Diagnosis not present

## 2019-04-25 ENCOUNTER — Telehealth (INDEPENDENT_AMBULATORY_CARE_PROVIDER_SITE_OTHER): Payer: Medicare Other | Admitting: Gastroenterology

## 2019-04-25 ENCOUNTER — Other Ambulatory Visit: Payer: Self-pay

## 2019-04-25 ENCOUNTER — Encounter: Payer: Self-pay | Admitting: Gastroenterology

## 2019-04-25 VITALS — Ht 60.0 in | Wt 109.0 lb

## 2019-04-25 DIAGNOSIS — R197 Diarrhea, unspecified: Secondary | ICD-10-CM

## 2019-04-25 DIAGNOSIS — G20 Parkinson's disease: Secondary | ICD-10-CM | POA: Diagnosis not present

## 2019-04-25 DIAGNOSIS — R159 Full incontinence of feces: Secondary | ICD-10-CM

## 2019-04-25 DIAGNOSIS — K289 Gastrojejunal ulcer, unspecified as acute or chronic, without hemorrhage or perforation: Secondary | ICD-10-CM | POA: Diagnosis not present

## 2019-04-25 DIAGNOSIS — Z9181 History of falling: Secondary | ICD-10-CM | POA: Diagnosis not present

## 2019-04-25 DIAGNOSIS — K219 Gastro-esophageal reflux disease without esophagitis: Secondary | ICD-10-CM | POA: Diagnosis not present

## 2019-04-25 DIAGNOSIS — Z7409 Other reduced mobility: Secondary | ICD-10-CM | POA: Diagnosis not present

## 2019-04-25 DIAGNOSIS — R269 Unspecified abnormalities of gait and mobility: Secondary | ICD-10-CM | POA: Diagnosis not present

## 2019-04-25 NOTE — Patient Instructions (Signed)
If you are age 74 or older, your body mass index should be between 23-30. Your Body mass index is 21.29 kg/m. If this is out of the aforementioned range listed, please consider follow up with your Primary Care Provider.  If you are age 8 or younger, your body mass index should be between 19-25. Your Body mass index is 21.29 kg/m. If this is out of the aformentioned range listed, please consider follow up with your Primary Care Provider.   To help prevent the possible spread of infection to our patients, communities, and staff; we will be implementing the following measures:  As of now we are not allowing any visitors/family members to accompany you to any upcoming appointments with [facility]. If you have any concerns about this please contact our office to discuss prior to the appointment.   Please purchase the following medications over the counter and take as directed: Imodium 2mg  as needed for diarrhea  Please start taking a daily fiber supplement such as citrucel, benefiber, metamucil, or fiber choice.   Your provider has requested that you go to the basement level for lab work at our [place] location ([address] [place] [address]) . Press "B" on the elevator. The lab is located at the first door on the left as you exit the elevator. You may go at whatever time is convienent for you. The current hours of operations are Monday- Friday 7:30am-4:30pm.  It was a pleasure to see you today!  Carrie Shannon, D.O.

## 2019-04-25 NOTE — Progress Notes (Signed)
Chief Complaint: Fecal seepage, diarrhea  GI Hx: 73 year old female initially seen by me on 10/16/2018 for persistent abdominal pain secondary to marginal ulcer.  She had been having upper abdominal pain for multiple years, lasting throughout the day and often debilitating to the point of not wanting to get out of bed.  Pain had been worsening.  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  Ulcer persisted despite Protonix 40 mg BID, Carafate 1 g QID, Zantac, misoprostol QID, and amitriptyline for the pain.  Treated with addition of Prevacid Solutabs and Pepcid along with continued misoprostol and Carafate and repeat EGD.  EGD in 10/2018  notable for 2 large marginal ulcers and anastomotic stenosis. Biopsies were negative for H. pylori.  Interestingly the larger of the 2 ulcers bled less than expected despite biopsies, suspicious for some degree of ischemia.  She was referred back to Dr. Toney Rakes.  PIC placed on 10/30/2018 for TPN and on 11/26/2018 had gastric bypass revision and G-tube placement and lap cruralplasty. Was doing well at f/u appt on 12/07/18, with significant overall improvement.   Evaluation to date per available records including: - 12/2001:RYGB -2012: EGD. No report available -2013: EGD. 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 Refer to Bariatric Surgery for nonhealing ulcer and possible partial obstruction at the outlet of the anastomosis - 04/2018: Seen by Dr. Evorn Gong who recommend referral to tertiary care center - 04/2018: Seen by Dr. Toney Rakes at [facility] Bariatric Surgery to discuss possible surgical options. Plan was to obtain her previous UGI series and follow-up. Gastrin elevated at 151. - CT abdomen/pelvis 08/2018 without  acute intra-abdominal pathology, compression deformity of T10  Endoscopic history: Colonoscopy (2009): No report available EGD (2012): No report available EGD (2013): No report available Colonoscopy (12/2016): Diverticulosis EGD (12/2016): Anastomotic ulcer EGD (03/2018): Anastomotic ulcer, hiatal hernia EGD (10/2018, Dr. Bryan Lemma): 3 cm hiatal hernia, large marginal ulcers, measuring 10 mm and 15 mm in size with mild stenosis of the anastomosis.  Biopsies negative for H. pylori.  Interestingly, the larger of the 2 ulcers blood less than expected with forceps, concerning for ischemia.  Erythematous gastric body, biopsies negative for H. pylori.    HPI:    Due to current restrictions/limitations of in-office visits due to the COVID-19 pandemic, this scheduled clinical appointment was converted to a telehealth virtual consultation using Doximity.  -Time of medical discussion: 21 minutes -The patient did consent to this virtual visit and is aware of possible charges through their insurance for this visit.  -Names of all parties present: Carrie Shannon (patient), Gerrit Heck, DO, [facility] (physician) -Patient location: Home -Physician location: Office  Carrie Shannon is a 73 y.o. female  presenting to the Gastroenterology Clinic for r new complaint of fecal seepage and diarrhea. Was last seen by me in 11/2018 following her revision of RYGB 2/2 recurrent marginal ulcer refractory to maximal medical management.  Doing well from an upper GI standpoint.  New c/o fecal seepage and diarrhea since May. Now all liquid stools, with 4-5 stools/day (baseline was 2-3 formed stools/day). No hematochezia or melena. No abdominal pain. No new medications or Abx (although likely had antibiotic exposure at time of surgery in 11/2018). Now afraid to go out.  Has trialed BRAT diet.  Also trialed Imodium with some improvement in frequency, but still liquid, watery stools. Maintaining good PO intake and weight  stable.  No prior similar symptoms.  Started on Sinemet in 03/2019 for clinical suspicion of parkinsonism.  Labs in 12/2018 n/f: H/H 10.1/33.3 (baseline  Hgb ~11), normal CMP, B12, folate, vitamin D.  Labs from [facility] and 03/2019: Normal B12, vitamin D.  Past medical history, past surgical history, social history, family history, medications, and allergies reviewed in the chart and with patient.    Past Medical History:  Diagnosis Date  . AC (acromioclavicular) joint bone spurs    heels  . Anemia   . Depression   . Elbow fracture, right   . Esophageal ring   . GERD (gastroesophageal reflux disease)   . Heart murmur   . Hiatal hernia    EGD 10-27-10/[facility]  . Shoulder fracture, right   . Wrist fracture, right      Past Surgical History:  Procedure Laterality Date  . ABDOMINAL HYSTERECTOMY  1980's    Fibroids  . CHOLECYSTECTOMY  2009  . COLONOSCOPY    . COLONOSCOPY WITH ESOPHAGOGASTRODUODENOSCOPY (EGD)  12/21/2016  . ESOPHAGOGASTRODUODENOSCOPY  03/16/2018   [facility]  . GASTRIC BYPASS  2003  . TUBAL LIGATION  1975  . Ulcer revision  11/26/2018   removed the ulcers  . UPPER GASTROINTESTINAL ENDOSCOPY     Family History  Problem Relation Age of Onset  . Cancer Father        esophageal and lung cancer. Said it spread all over  . Heart attack Mother   . Stroke Mother 66  . Colon cancer Neg Hx   . Rectal cancer Neg Hx   . Stomach cancer Neg Hx    Social History   Tobacco Use  . Smoking status: Never Smoker  . Smokeless tobacco: Never Used  Substance Use Topics  . Alcohol use: No  . Drug use: No   Current Outpatient Medications  Medication Sig Dispense Refill  . amitriptyline (ELAVIL) 25 MG tablet Take 25 mg by mouth at bedtime.    . carbidopa-levodopa (SINEMET IR) 25-100 MG tablet Take 1 tablet by mouth 2 (two) times a day.    . lansoprazole (PREVACID) 30 MG capsule Take 30 mg by mouth 2 (two) times a day.     . ondansetron (ZOFRAN) 4 MG tablet Take 4 mg by mouth every 8 (eight) hours as needed.  1   Current Facility-Administered Medications  Medication Dose Route Frequency Provider Last Rate Last Dose  . 0.9 %  sodium chloride infusion  500 mL Intravenous Once Kamaiya Antilla V, DO       Allergies  Allergen Reactions  . Doxepin Other (See Comments)    Headaches  Other reaction(s): Other Headaches Headaches   . Other Rash  . Tape Rash    Band aids     Review of Systems: All systems reviewed and negative except where noted in HPI.     Physical Exam:    Complete physical exam not completed due to the nature of this telehealth communication.   Gen: Awake, alert, and oriented, and well communicative. HEENT: EOMI, non-icteric sclera, NCAT, MMM Neck: Normal movement of head and neck Pulm: No labored breathing, speaking in full sentences without conversational dyspnea Derm: No apparent lesions or bruising in visible field MS: Moves all visible extremities without noticeable abnormality Psych: Pleasant, cooperative, normal speech, thought processing seemingly intact  ASSESSMENT AND PLAN;   1) Fecal seepage 2) Diarrhea Acute onset watery stools and fecal seepage over the last few months.  No preceding change in medications.  Was started on Sinemet in 03/2019 (symptoms already started at that time).  Denies any known antibiotics closure, although certainly conceivable that she had antibiotics in her perioperative period during bypass revision in 11/2018.  Plan for the following:  - Check stool studies for GI PCR panel, C. 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  She is doing quite well postop, tolerating p.o. intake.  4) History of hiatal hernia: -Corrected at time of crural plasty as above.  No reflux symptoms.  5) GERD: - Essentially corrected at time of hernia repair as above.  Okay to use PPI on a prn basis only  I spent over 15 minutes of virtual face-to-face time with the patient. Greater than 50% of the time was spent counseling and coordinating care.     Lavena Bullion, DO, FACG  04/25/2019, 8:25 AM   Hali Marry, *

## 2019-04-30 DIAGNOSIS — Z7409 Other reduced mobility: Secondary | ICD-10-CM | POA: Diagnosis not present

## 2019-04-30 DIAGNOSIS — R269 Unspecified abnormalities of gait and mobility: Secondary | ICD-10-CM | POA: Diagnosis not present

## 2019-04-30 DIAGNOSIS — G20 Parkinson's disease: Secondary | ICD-10-CM | POA: Diagnosis not present

## 2019-04-30 DIAGNOSIS — Z9181 History of falling: Secondary | ICD-10-CM | POA: Diagnosis not present

## 2019-05-02 DIAGNOSIS — R002 Palpitations: Secondary | ICD-10-CM | POA: Diagnosis not present

## 2019-05-02 DIAGNOSIS — Z7409 Other reduced mobility: Secondary | ICD-10-CM | POA: Diagnosis not present

## 2019-05-02 DIAGNOSIS — I6523 Occlusion and stenosis of bilateral carotid arteries: Secondary | ICD-10-CM | POA: Diagnosis not present

## 2019-05-02 DIAGNOSIS — R55 Syncope and collapse: Secondary | ICD-10-CM | POA: Diagnosis not present

## 2019-05-02 DIAGNOSIS — Z9181 History of falling: Secondary | ICD-10-CM | POA: Diagnosis not present

## 2019-05-02 DIAGNOSIS — G20 Parkinson's disease: Secondary | ICD-10-CM | POA: Diagnosis not present

## 2019-05-02 DIAGNOSIS — R269 Unspecified abnormalities of gait and mobility: Secondary | ICD-10-CM | POA: Diagnosis not present

## 2019-05-02 DIAGNOSIS — R42 Dizziness and giddiness: Secondary | ICD-10-CM | POA: Diagnosis not present

## 2019-05-06 ENCOUNTER — Other Ambulatory Visit: Payer: Medicare Other

## 2019-05-06 ENCOUNTER — Other Ambulatory Visit (INDEPENDENT_AMBULATORY_CARE_PROVIDER_SITE_OTHER): Payer: Medicare Other

## 2019-05-06 DIAGNOSIS — R159 Full incontinence of feces: Secondary | ICD-10-CM | POA: Diagnosis not present

## 2019-05-06 DIAGNOSIS — R197 Diarrhea, unspecified: Secondary | ICD-10-CM

## 2019-05-06 LAB — BASIC METABOLIC PANEL
BUN: 11 mg/dL (ref 6–23)
CO2: 27 mEq/L (ref 19–32)
Calcium: 8.7 mg/dL (ref 8.4–10.5)
Chloride: 108 mEq/L (ref 96–112)
Creatinine, Ser: 0.62 mg/dL (ref 0.40–1.20)
GFR: 94.13 mL/min (ref >60.00)
Glucose, Bld: 76 mg/dL (ref 70–99)
Potassium: 4 mEq/L (ref 3.5–5.1)
Sodium: 143 mEq/L (ref 135–145)

## 2019-05-06 LAB — CBC WITH DIFFERENTIAL/PLATELET
Basophils Absolute: 0.1 10*3/uL (ref 0.0–0.1)
Basophils Relative: 1.1 % (ref 0.0–3.0)
Eosinophils Absolute: 0.2 10*3/uL (ref 0.0–0.7)
Eosinophils Relative: 3.3 % (ref 0.0–5.0)
HCT: 33.2 % — ABNORMAL LOW (ref 36.0–46.0)
Hemoglobin: 10.8 g/dL — ABNORMAL LOW (ref 12.0–15.0)
Lymphocytes Relative: 35.4 % (ref 12.0–46.0)
Lymphs Abs: 1.9 10*3/uL (ref 0.7–4.0)
MCHC: 32.4 g/dL (ref 30.0–36.0)
MCV: 94.2 fl (ref 78.0–100.0)
Monocytes Absolute: 0.5 10*3/uL (ref 0.1–1.0)
Monocytes Relative: 8.3 % (ref 3.0–12.0)
Neutro Abs: 2.9 10*3/uL (ref 1.4–7.7)
Neutrophils Relative %: 51.9 % (ref 43.0–77.0)
Platelets: 319 10*3/uL (ref 150.0–400.0)
RBC: 3.53 Mil/uL — ABNORMAL LOW (ref 3.87–5.11)
RDW: 15.6 % — ABNORMAL HIGH (ref 11.5–15.5)
WBC: 5.5 10*3/uL (ref 4.0–10.5)

## 2019-05-07 DIAGNOSIS — Z9181 History of falling: Secondary | ICD-10-CM | POA: Diagnosis not present

## 2019-05-07 DIAGNOSIS — Z7409 Other reduced mobility: Secondary | ICD-10-CM | POA: Diagnosis not present

## 2019-05-07 DIAGNOSIS — G20 Parkinson's disease: Secondary | ICD-10-CM | POA: Diagnosis not present

## 2019-05-07 DIAGNOSIS — R269 Unspecified abnormalities of gait and mobility: Secondary | ICD-10-CM | POA: Diagnosis not present

## 2019-05-07 LAB — GASTROINTESTINAL PATHOGEN PANEL PCR
C. difficile Tox A/B, PCR: NOT DETECTED
Campylobacter, PCR: NOT DETECTED
Cryptosporidium, PCR: NOT DETECTED
E coli (ETEC) LT/ST PCR: NOT DETECTED
E coli (STEC) stx1/stx2, PCR: NOT DETECTED
E coli 0157, PCR: NOT DETECTED
Giardia lamblia, PCR: NOT DETECTED
Norovirus, PCR: NOT DETECTED
Rotavirus A, PCR: NOT DETECTED
Salmonella, PCR: NOT DETECTED
Shigella, PCR: NOT DETECTED

## 2019-05-07 LAB — GIARDIA ANTIGEN
MICRO NUMBER:: 706872
RESULT:: NOT DETECTED
SPECIMEN QUALITY:: ADEQUATE

## 2019-05-22 DIAGNOSIS — Z9884 Bariatric surgery status: Secondary | ICD-10-CM | POA: Diagnosis not present

## 2019-05-22 DIAGNOSIS — G20 Parkinson's disease: Secondary | ICD-10-CM | POA: Diagnosis not present

## 2019-05-22 DIAGNOSIS — R197 Diarrhea, unspecified: Secondary | ICD-10-CM | POA: Diagnosis not present

## 2019-05-30 DIAGNOSIS — R42 Dizziness and giddiness: Secondary | ICD-10-CM | POA: Diagnosis not present

## 2019-05-30 DIAGNOSIS — R55 Syncope and collapse: Secondary | ICD-10-CM | POA: Diagnosis not present

## 2019-05-30 DIAGNOSIS — R002 Palpitations: Secondary | ICD-10-CM | POA: Diagnosis not present

## 2019-05-31 DIAGNOSIS — I471 Supraventricular tachycardia: Secondary | ICD-10-CM | POA: Diagnosis not present

## 2019-06-25 DIAGNOSIS — Z23 Encounter for immunization: Secondary | ICD-10-CM | POA: Diagnosis not present

## 2019-07-02 DIAGNOSIS — G20 Parkinson's disease: Secondary | ICD-10-CM | POA: Diagnosis not present

## 2019-07-03 ENCOUNTER — Ambulatory Visit: Payer: Medicare Other | Admitting: Gastroenterology

## 2019-07-05 DIAGNOSIS — R42 Dizziness and giddiness: Secondary | ICD-10-CM | POA: Diagnosis not present

## 2019-07-05 DIAGNOSIS — K257 Chronic gastric ulcer without hemorrhage or perforation: Secondary | ICD-10-CM | POA: Diagnosis not present

## 2019-07-05 DIAGNOSIS — E538 Deficiency of other specified B group vitamins: Secondary | ICD-10-CM | POA: Diagnosis not present

## 2019-07-05 DIAGNOSIS — Z1231 Encounter for screening mammogram for malignant neoplasm of breast: Secondary | ICD-10-CM | POA: Diagnosis not present

## 2019-07-05 DIAGNOSIS — K760 Fatty (change of) liver, not elsewhere classified: Secondary | ICD-10-CM | POA: Diagnosis not present

## 2019-07-05 DIAGNOSIS — G20 Parkinson's disease: Secondary | ICD-10-CM | POA: Diagnosis not present

## 2019-07-05 DIAGNOSIS — R1084 Generalized abdominal pain: Secondary | ICD-10-CM | POA: Diagnosis not present

## 2019-07-05 DIAGNOSIS — K219 Gastro-esophageal reflux disease without esophagitis: Secondary | ICD-10-CM | POA: Diagnosis not present

## 2019-07-05 DIAGNOSIS — R110 Nausea: Secondary | ICD-10-CM | POA: Diagnosis not present

## 2019-07-05 DIAGNOSIS — M810 Age-related osteoporosis without current pathological fracture: Secondary | ICD-10-CM | POA: Diagnosis not present

## 2019-07-05 DIAGNOSIS — Z9181 History of falling: Secondary | ICD-10-CM | POA: Diagnosis not present

## 2019-07-05 DIAGNOSIS — Z7409 Other reduced mobility: Secondary | ICD-10-CM | POA: Diagnosis not present

## 2019-07-09 ENCOUNTER — Other Ambulatory Visit: Payer: Self-pay

## 2019-07-09 ENCOUNTER — Ambulatory Visit (INDEPENDENT_AMBULATORY_CARE_PROVIDER_SITE_OTHER): Payer: Medicare Other | Admitting: Gastroenterology

## 2019-07-09 ENCOUNTER — Encounter: Payer: Self-pay | Admitting: Gastroenterology

## 2019-07-09 VITALS — BP 108/68 | HR 87 | Temp 97.8°F | Resp 18 | Ht 60.0 in | Wt 107.6 lb

## 2019-07-09 DIAGNOSIS — K219 Gastro-esophageal reflux disease without esophagitis: Secondary | ICD-10-CM | POA: Diagnosis not present

## 2019-07-09 DIAGNOSIS — R110 Nausea: Secondary | ICD-10-CM | POA: Diagnosis not present

## 2019-07-09 DIAGNOSIS — R197 Diarrhea, unspecified: Secondary | ICD-10-CM

## 2019-07-09 DIAGNOSIS — R1013 Epigastric pain: Secondary | ICD-10-CM

## 2019-07-09 DIAGNOSIS — R159 Full incontinence of feces: Secondary | ICD-10-CM | POA: Diagnosis not present

## 2019-07-09 DIAGNOSIS — K449 Diaphragmatic hernia without obstruction or gangrene: Secondary | ICD-10-CM

## 2019-07-09 MED ORDER — DIPHENOXYLATE-ATROPINE 2.5-0.025 MG PO TABS: ORAL_TABLET | ORAL | 0 refills | Status: DC

## 2019-07-09 MED ORDER — ONDANSETRON HCL 4 MG PO TABS: 4.0000 mg | ORAL_TABLET | Freq: Three times a day (TID) | ORAL | 1 refills | Status: DC | PRN

## 2019-07-09 MED ORDER — ONDANSETRON HCL 4 MG PO TABS: 4.0000 mg | ORAL_TABLET | Freq: Three times a day (TID) | ORAL | 3 refills | Status: DC | PRN

## 2019-07-09 MED ORDER — SUPREP BOWEL PREP KIT 17.5-3.13-1.6 GM/177ML PO SOLN: 1.0000 | ORAL | 0 refills | Status: DC

## 2019-07-09 NOTE — Patient Instructions (Addendum)
If you are age 74 or older, your body mass index should be between 23-30. Your Body mass index is 21.01 kg/m. If this is out of the aforementioned range listed, please consider follow up with your Primary Care Provider.  If you are age 52 or younger, your body mass index should be between 19-25. Your Body mass index is 21.01 kg/m. If this is out of the aformentioned range listed, please consider follow up with your Primary Care Provider.   You have been scheduled for a colonoscopy. Please follow written instructions given to you at your visit today.  Please pick up your prep supplies at the pharmacy within the next 1-3 days. If you use inhalers (even only as needed), please bring them with you on the day of your procedure. Your physician has requested that you go to www.startemmi.com and enter the access code given to you at your visit today. This web site gives a general overview about your procedure. However, you should still follow specific instructions given to you by our office regarding your preparation for the procedure.  We have sent the following medications to your pharmacy for you to pick up at your convenience: Lomotil Zofran  Follow up in 3 months.

## 2019-07-09 NOTE — Progress Notes (Signed)
P  Chief Complaint:   Diarrhea, fecal seepage, nausea without emesis  GI History: 73 year old female initially seen by me on 10/16/2018 for persistent abdominal pain secondary to marginal ulcer. She had been having upper abdominal pain for multiple years, lasting throughout the day and often debilitating to the point of not wanting to get out of bed. Pain had been worsening. She had a history of marginal ulcers for approximately 10 years after a previous RYGBin 2003. Ulcer persisted despite Protonix 40 mg BID,Carafate 1 gQID,Zantac, misoprostolQID, and amitriptyline for the pain.  Treated with addition of Prevacid Solutabs and Pepcid along with continued misoprostol and Carafate and repeat EGD.  EGD in 10/2018  notable for 2 large marginal ulcers and anastomotic stenosis. Biopsies were negative for H. pylori. Interestingly the larger of the 2 ulcers bled less than expected despite biopsies, suspicious for some degree of ischemia. She was referred back to Dr. Toney Rakes. PIC placed on 10/30/2018 for TPN and on 11/26/2018 had gastric bypass revision and G-tube placement and lap cruralplasty.   Has had resolution of upper GI symptoms.  Evaluation to date per available records including: -12/2001:RYGB -2012: EGD. No report available -2013: EGD. 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 Refer to Bariatric Surgery for nonhealing ulcer and possible partial obstruction at the outlet of the anastomosis -04/2018: Seen by Dr. Evorn Gong who recommend referral to tertiary care center -04/2018: Seen by Dr. Toney Rakes at [facility] Bariatric Surgery to discuss possible surgical options. Plan was to obtain her previous UGI series and follow-up. Gastrin elevated at 151. -CT abdomen/pelvis 08/2018 without acute  intra-abdominal pathology, compression deformity of T10  Endoscopic history: Colonoscopy (2009): No report available EGD (2012): No report available EGD (2013): No report available Colonoscopy (12/2016): Diverticulosis EGD (12/2016): Anastomotic ulcer EGD (03/2018): Anastomotic ulcer, hiatal hernia EGD (10/2018, Dr. Bryan Lemma): 3 cm hiatal hernia, large marginal ulcers, measuring 10 mm and 15 mm in size with mild stenosis of the anastomosis. Biopsies negative for H. pylori. Interestingly, the larger of the 2 ulcers blood less than expected with forceps, concerning for ischemia. Erythematous gastric body, biopsies negative for H. pylori.   HPI:    Patient is a 73 y.o. female presenting to the Gastroenterology Clinic for follow-up.  Was last seen by me as a telehealth visit in 04/2019, and at that time endorsed fecal seepage/diarrhea.  Endorsed liquid stools, 4-5 stools/day (baseline 2-3 formed stools/day).  No hematochezia or melena.  No abdominal pain.  Thought possibly 2/2 new Sinemet in 03/2019.  Work-up unrevealing to include GI PCR panel, C. difficile, Giardia, CBC, BMP.  Resumed Imodium, started fiber supplement.  Planned for colonoscopy if no appreciable improvement.  Today, she states she continues to have liquid stools and episodic fecal seepage.  No longer having any improvement/relief with Imodium. Very briefly trialed fiber gummies, but stopped.    Was recently seen in the Bariatric Surgery clinic at [facility] for follow-up after surgical revision earlier this year for persistent anastomotic ulcer along with [facility] repair.  Reflux essentially resolved following surgery.    Follows in the Neurology clinic for tremors (parkinsonism vs essential tremor).  Has had nausea since starting Sinemet.  No emesis. Resolves with Zofran prn. Nausea only startes after taking Sinemet in AM, and typically relieves within 1.5 hours. Has improved since changing to sustained release.   Review of systems:  No chest pain, no SOB, no fevers, no urinary sx   Past Medical History:  Diagnosis Date   AC (acromioclavicular) joint bone spurs    heels   Anemia    Elbow fracture, right    Esophageal ring    GERD (gastroesophageal reflux disease)    Heart murmur    Hiatal hernia    EGD 10-27-10/[facility]   Shoulder fracture, right    Wrist fracture, right     Patient's surgical history, family medical history, social history, medications and allergies were all reviewed in Epic    Current Outpatient Medications  Medication Sig Dispense Refill   amitriptyline (ELAVIL) 25 MG tablet Take 25 mg by mouth at bedtime.     carbidopa-levodopa (SINEMET IR) 25-100 MG tablet Take 1 tablet by mouth 2 (two) times a day.     ergocalciferol (VITAMIN D2) 1.25 MG (50000 UT) capsule Take 50,000 Units by mouth once a week.     lansoprazole (PREVACID) 30 MG capsule Take 30 mg by mouth 2 (two) times a day.     ondansetron (ZOFRAN) 4 MG tablet Take 4 mg by mouth every 8 (eight) hours as needed.  1   Current Facility-Administered Medications  Medication Dose Route Frequency Provider Last Rate Last Dose   0.9 %  sodium chloride infusion  500 mL Intravenous Once Travious Vanover V, DO        Physical Exam:     BP 108/68 (BP Location: Left Arm, Patient Position: Sitting, Cuff Size: Normal)    Pulse 87    Temp 97.8 F (36.6 C) (Oral)    Resp 18    Ht 5' (1.524 m)    Wt 107 lb 9.6 oz (48.8 kg)    SpO2 100%    BMI 21.01 kg/m   GENERAL:  Pleasant female in NAD PSYCH: : Cooperative, normal affect EENT:  conjunctiva pink, mucous membranes moist, neck supple without masses CARDIAC:  RRR, no murmur heard, no peripheral edema PULM: Normal respiratory effort, lungs CTA bilaterally, no wheezing ABDOMEN:  Nondistended, soft, nontender. No obvious masses, no hepatomegaly,  normal bowel sounds SKIN:  turgor, no lesions seen Musculoskeletal:  Normal muscle tone, normal strength NEURO: Alert  and oriented x 3, no focal neurologic deficits   IMPRESSION and PLAN:    1) Diarrhea 2) Fecal Seepage  Ongoing loose, watery, nonbloody stools and fecal seepage.  No longer with any improvement with Imodium.  Stool studies negative.  Discussed DDX today, to include MC, IVSD, medication ADR and will evaluate and treat as below: - Change Imodium to Lomotil-take qid until diarrhea improves, then can take prn -Colonoscopy with random and directed biopsies - Start fiber supplement - Stopping PPI as below - Takes Elavil for ?sleep - Sinemet carries a ADR of diarrhea and approximately 5% (much more common of constipation) -If colonoscopy unrevealing and ongoing symptoms, ARM -Can trial probiotic  3) Nausea - Seems temporally related to Sinemet.  Has improved since changing to sustained release Sinemet - Refill Zofran 4 mg prn -P.o. intake as tolerated  4) GERD 5) History of hiatal hernia -Hernia corrected at time of crural plasty.  Reflux symptoms have essentially resolved. 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 She is doing quite well postop, tolerating p.o. intake.  The indications, risks, and benefits of colonoscopy  were explained to the patient in detail. Risks include but are not limited to bleeding, perforation, adverse reaction to medications, and cardiopulmonary compromise. Sequelae include but are not limited to the possibility of surgery, hospitalization, and mortality. The patient verbalized understanding and wished to proceed. All questions answered, referred to the scheduler and bowel prep ordered. Further recommendations pending results of the exam.            Lavena Bullion ,DO, FACG 07/09/2019, 9:50 AM

## 2019-08-05 ENCOUNTER — Other Ambulatory Visit: Payer: Self-pay | Admitting: Gastroenterology

## 2019-08-06 ENCOUNTER — Other Ambulatory Visit: Payer: Self-pay

## 2019-08-06 MED ORDER — DIPHENOXYLATE-ATROPINE 2.5-0.025 MG PO TABS: ORAL_TABLET | ORAL | 0 refills | Status: AC

## 2019-08-09 ENCOUNTER — Ambulatory Visit: Payer: Medicare Other | Admitting: Gastroenterology

## 2019-08-12 ENCOUNTER — Encounter: Payer: Self-pay | Admitting: Gastroenterology

## 2019-08-12 ENCOUNTER — Ambulatory Visit (AMBULATORY_SURGERY_CENTER): Payer: Medicare Other | Admitting: Gastroenterology

## 2019-08-12 ENCOUNTER — Other Ambulatory Visit: Payer: Self-pay

## 2019-08-12 VITALS — BP 128/67 | HR 71 | Temp 95.3°F | Resp 12 | Ht 60.0 in | Wt 107.0 lb

## 2019-08-12 DIAGNOSIS — R197 Diarrhea, unspecified: Secondary | ICD-10-CM

## 2019-08-12 DIAGNOSIS — K5730 Diverticulosis of large intestine without perforation or abscess without bleeding: Secondary | ICD-10-CM

## 2019-08-12 DIAGNOSIS — D122 Benign neoplasm of ascending colon: Secondary | ICD-10-CM

## 2019-08-12 DIAGNOSIS — K635 Polyp of colon: Secondary | ICD-10-CM | POA: Diagnosis not present

## 2019-08-12 HISTORY — PX: COLONOSCOPY: SHX174

## 2019-08-12 MED ORDER — SODIUM CHLORIDE 0.9 % IV SOLN: 500.0000 mL | Freq: Once | INTRAVENOUS | Status: DC

## 2019-08-12 NOTE — Progress Notes (Signed)
Temp YF Vitals CW and KA

## 2019-08-12 NOTE — Patient Instructions (Signed)
Please read handouts provided. Continue present medications. Await pathology results. Use fiber, for example Citrucel, Fibercon, Konsyl, or Metamucil.      YOU HAD AN ENDOSCOPIC PROCEDURE TODAY AT [facility]:   Refer to the procedure report that was given to you for any specific questions about what was found during the examination.  If the procedure report does not answer your questions, please call your gastroenterologist to clarify.  If you requested that your care partner not be given the details of your procedure findings, then the procedure report has been included in a sealed envelope for you to review at your convenience later.  YOU SHOULD EXPECT: Some feelings of bloating in the abdomen. Passage of more gas than usual.  Walking can help get rid of the air that was put into your GI tract during the procedure and reduce the bloating. If you had a lower endoscopy (such as a colonoscopy or flexible sigmoidoscopy) you may notice spotting of blood in your stool or on the toilet paper. If you underwent a bowel prep for your procedure, you may not have a normal bowel movement for a few days.  Please Note:  You might notice some irritation and congestion in your nose or some drainage.  This is from the oxygen used during your procedure.  There is no need for concern and it should clear up in a day or so.  SYMPTOMS TO REPORT IMMEDIATELY:   Following lower endoscopy (colonoscopy or flexible sigmoidoscopy):  Excessive amounts of blood in the stool  Significant tenderness or worsening of abdominal pains  Swelling of the abdomen that is new, acute  Fever of 100F or higher   For urgent or emergent issues, a gastroenterologist can be reached at any hour by calling [phone].   DIET:  We do recommend a small meal at first, but then you may proceed to your regular diet.  Drink plenty of fluids but you should avoid alcoholic beverages for 24 hours.  ACTIVITY:  You should  plan to take it easy for the rest of today and you should NOT DRIVE or use heavy machinery until tomorrow (because of the sedation medicines used during the test).    FOLLOW UP: Our staff will call the number listed on your records 48-72 hours following your procedure to check on you and address any questions or concerns that you may have regarding the information given to you following your procedure. If we do not reach you, we will leave a message.  We will attempt to reach you two times.  During this call, we will ask if you have developed any symptoms of COVID 19. If you develop any symptoms (ie: fever, flu-like symptoms, shortness of breath, cough etc.) before then, please call [phone].  If you test positive for Covid 19 in the 2 weeks post procedure, please call and report this information to [place].    If any biopsies were taken you will be contacted by phone or by letter within the next 1-3 weeks.  Please call us at [phone] if you have not heard about the biopsies in 3 weeks.    SIGNATURES/CONFIDENTIALITY: You and/or your care partner have signed paperwork which will be entered into your electronic medical record.  These signatures attest to the fact that that the information above on your After Visit Summary has been reviewed and is understood.  Full responsibility of the confidentiality of this discharge information lies with you and/or your care-partner.

## 2019-08-12 NOTE — Progress Notes (Signed)
To PACU, VSS. Report to [email or web address] 

## 2019-08-12 NOTE — Op Note (Addendum)
[place] Patient Name: Jehan Gerrish Procedure Date: 08/12/2019 9:04 AM MRN: [id number] Endoscopist: Gerrit Heck , MD Age: 73 Referring MD:  Date of Birth: [birth year] Gender: Female Account #: [id number] Procedure:                Colonoscopy Indications:              Change in bowel habits, Diarrhea, Fecal                            incontinence/Fecal seepage Medicines:                Monitored Anesthesia Care Procedure:                Pre-Anesthesia Assessment:                           - Prior to the procedure, a History and Physical                            was performed, and patient medications and                            allergies were reviewed. The patient's tolerance of                            previous anesthesia was also reviewed. The risks                            and benefits of the procedure and the sedation                            options and risks were discussed with the patient.                            All questions were answered, and informed consent                            was obtained. Prior Anticoagulants: The patient has                            taken no previous anticoagulant or antiplatelet                            agents. ASA Grade Assessment: III - A patient with                            severe systemic disease. After reviewing the risks                            and benefits, the patient was deemed in                            satisfactory condition to undergo the procedure.  After obtaining informed consent, the colonoscope                            was passed under direct vision. Throughout the                            procedure, the patient's blood pressure, pulse, and                            oxygen saturations were monitored continuously. The                            Colonoscope was introduced through the anus and                            advanced to the 1 cm into the ileum. The                             colonoscopy was performed without difficulty. The                            patient tolerated the procedure well. The quality                            of the bowel preparation was adequate. The terminal                            ileum, ileocecal valve, appendiceal orifice, and                            rectum were photographed. Scope In: 9:19:10 AM Scope Out: 9:41:14 AM Scope Withdrawal Time: 0 hours 14 minutes 4 seconds  Total Procedure Duration: 0 hours 22 minutes 4 seconds  Findings:                 The perianal and digital rectal examinations were                            normal.                           A 2 mm polyp was found in the ascending colon. The                            polyp was sessile. The polyp was removed with a                            cold biopsy forceps. Resection and retrieval were                            complete. Estimated blood loss was minimal.                           Multiple small and large-mouthed diverticula were  found in the entire colon.                           The mucosa was otherwise normal appearing                            throughout the remainder of the colon. Biopsies for                            histology were taken with a cold forceps from the                            right colon and left colon for evaluation of                            microscopic colitis. Estimated blood loss was                            minimal.                           Retroflexion in the rectum was not performed due to                            anatomy- narrow, short rectal vault. Anterograde                            views were otherwise normal appearing.                           The terminal ileum appeared normal. Complications:            No immediate complications. Estimated Blood Loss:     Estimated blood loss was minimal. Impression:               - One 2 mm polyp in the ascending  colon, removed                            with a cold biopsy forceps. Resected and retrieved.                           - Diverticulosis in the entire examined colon.                           - Normal mucosa in the entire examined colon.                            Biopsied.                           - The examined portion of the ileum was normal. Recommendation:           - Patient has a contact number available for                            emergencies.  The signs and symptoms of potential                            delayed complications were discussed with the                            patient. Return to normal activities tomorrow.                            Written discharge instructions were provided to the                            patient.                           - Resume previous diet.                           - Continue present medications.                           - Use fiber, for example Citrucel, Fibercon, Konsyl                            or Metamucil.                           - Await pathology results.                           - Repeat colonoscopy is not recommended due to                            current age (19 years or older) for screening                            purposes.                           - Return to GI clinic at appointment to be                            scheduled.                           - If the biopsies are unrevealing, and symptoms                            persist, will plan for anal manometry. Gerrit Heck, MD 08/12/2019 9:50:01 AM

## 2019-08-12 NOTE — Progress Notes (Signed)
Called to room to assist during endoscopic procedure.  Patient ID and intended procedure confirmed with present staff. Received instructions for my participation in the procedure from the performing physician.  

## 2019-08-14 ENCOUNTER — Telehealth: Payer: Self-pay

## 2019-08-14 NOTE — Telephone Encounter (Signed)
  Follow up Call-  Call back number 08/12/2019 10/17/2018  Post procedure Call Back phone  # [phone]  Permission to leave phone message Yes Yes  Some recent data might be hidden     Patient questions:  Do you have a fever, pain , or abdominal swelling? No. Pain Score  0 *  Have you tolerated food without any problems? Yes.    Have you been able to return to your normal activities? Yes.    Do you have any questions about your discharge instructions: Diet   No. Medications  No. Follow up visit  No.  Do you have questions or concerns about your Care? No.  Actions: * If pain score is 4 or above: No action needed, pain <4. 1. Have you developed a fever since your procedure? no  2.   Have you had an respiratory symptoms (SOB or cough) since your procedure? no  3.   Have you tested positive for COVID 19 since your procedure no  4.   Have you had any family members/close contacts diagnosed with the COVID 19 since your procedure?  no   If yes to any of these questions please route to Joylene John, RN and Alphonsa Gin, Therapist, sports.

## 2019-08-22 ENCOUNTER — Encounter: Payer: Self-pay | Admitting: Gastroenterology

## 2019-08-23 ENCOUNTER — Other Ambulatory Visit: Payer: Self-pay | Admitting: Gastroenterology

## 2019-08-23 DIAGNOSIS — R197 Diarrhea, unspecified: Secondary | ICD-10-CM

## 2019-08-23 DIAGNOSIS — K5730 Diverticulosis of large intestine without perforation or abscess without bleeding: Secondary | ICD-10-CM

## 2019-09-09 ENCOUNTER — Other Ambulatory Visit (HOSPITAL_COMMUNITY)
Admission: RE | Admit: 2019-09-09 | Discharge: 2019-09-09 | Disposition: A | Discharge status: Home / Self Care | Payer: Medicare Other | Source: Ambulatory Visit | Attending: Gastroenterology | Admitting: Gastroenterology

## 2019-09-09 DIAGNOSIS — Z20828 Contact with and (suspected) exposure to other viral communicable diseases: Secondary | ICD-10-CM | POA: Insufficient documentation

## 2019-09-09 DIAGNOSIS — Z01812 Encounter for preprocedural laboratory examination: Secondary | ICD-10-CM | POA: Diagnosis not present

## 2019-09-09 LAB — SARS CORONAVIRUS 2 (TAT 6-24 HRS): SARS Coronavirus 2: NEGATIVE

## 2019-09-10 ENCOUNTER — Ambulatory Visit: Payer: Medicare Other | Admitting: Gastroenterology

## 2019-09-10 NOTE — Progress Notes (Signed)
Called to confirm quarantine for scheduled procedure; patient has no symptoms. Answered all questions.

## 2019-09-11 ENCOUNTER — Ambulatory Visit (HOSPITAL_COMMUNITY)
Admission: RE | Admit: 2019-09-11 | Discharge: 2019-09-11 | Disposition: A | Discharge status: Home / Self Care | Payer: Medicare Other | Attending: Gastroenterology | Admitting: Gastroenterology

## 2019-09-11 ENCOUNTER — Encounter (HOSPITAL_COMMUNITY)
Admission: RE | Disposition: A | Discharge status: Home / Self Care | Payer: Self-pay | Source: Home / Self Care | Attending: Gastroenterology

## 2019-09-11 ENCOUNTER — Encounter (HOSPITAL_COMMUNITY): Payer: Self-pay | Admitting: Gastroenterology

## 2019-09-11 DIAGNOSIS — K6289 Other specified diseases of anus and rectum: Secondary | ICD-10-CM

## 2019-09-11 DIAGNOSIS — R159 Full incontinence of feces: Secondary | ICD-10-CM

## 2019-09-11 HISTORY — PX: ANAL RECTAL MANOMETRY: SHX6358

## 2019-09-11 SURGERY — MANOMETRY, ANORECTAL

## 2019-09-11 NOTE — Progress Notes (Signed)
Anal manometry done per protocol. Pt tolerated well without distress or complication   

## 2019-09-12 DIAGNOSIS — K6289 Other specified diseases of anus and rectum: Secondary | ICD-10-CM

## 2019-09-12 DIAGNOSIS — R159 Full incontinence of feces: Secondary | ICD-10-CM

## 2019-09-16 ENCOUNTER — Ambulatory Visit (INDEPENDENT_AMBULATORY_CARE_PROVIDER_SITE_OTHER): Payer: Medicare Other | Admitting: Gastroenterology

## 2019-09-16 ENCOUNTER — Other Ambulatory Visit: Payer: Self-pay

## 2019-09-16 ENCOUNTER — Other Ambulatory Visit: Payer: Self-pay | Admitting: Gastroenterology

## 2019-09-16 ENCOUNTER — Encounter: Payer: Self-pay | Admitting: Gastroenterology

## 2019-09-16 VITALS — BP 102/64 | HR 80 | Temp 98.4°F | Ht 60.0 in | Wt 104.4 lb

## 2019-09-16 DIAGNOSIS — F488 Other specified nonpsychotic mental disorders: Secondary | ICD-10-CM | POA: Diagnosis not present

## 2019-09-16 DIAGNOSIS — R110 Nausea: Secondary | ICD-10-CM | POA: Diagnosis not present

## 2019-09-16 DIAGNOSIS — K289 Gastrojejunal ulcer, unspecified as acute or chronic, without hemorrhage or perforation: Secondary | ICD-10-CM

## 2019-09-16 DIAGNOSIS — R151 Fecal smearing: Secondary | ICD-10-CM

## 2019-09-16 DIAGNOSIS — K219 Gastro-esophageal reflux disease without esophagitis: Secondary | ICD-10-CM | POA: Diagnosis not present

## 2019-09-16 DIAGNOSIS — R197 Diarrhea, unspecified: Secondary | ICD-10-CM | POA: Diagnosis not present

## 2019-09-16 DIAGNOSIS — K449 Diaphragmatic hernia without obstruction or gangrene: Secondary | ICD-10-CM

## 2019-09-16 DIAGNOSIS — Z23 Encounter for immunization: Secondary | ICD-10-CM | POA: Diagnosis not present

## 2019-09-16 DIAGNOSIS — R4189 Other symptoms and signs involving cognitive functions and awareness: Secondary | ICD-10-CM

## 2019-09-16 DIAGNOSIS — R1013 Epigastric pain: Secondary | ICD-10-CM

## 2019-09-16 MED ORDER — ONDANSETRON HCL 4 MG PO TABS: 4.0000 mg | ORAL_TABLET | Freq: Three times a day (TID) | ORAL | 3 refills | Status: AC | PRN

## 2019-09-16 NOTE — Progress Notes (Signed)
P  Chief Complaint:    Diarrhea, fecal seepage, nausea  GI History: 73 year old female initially seen by me on 10/16/2018 forpersistent abdominal pain secondary to marginal ulcer. She had been having upper abdominal pain for multiple years, lasting throughout the day and often debilitating to the point of not wanting to get out of bed. Pain had been worsening. She had a history of marginal ulcers for approximately 10 years after a previous RYGBin 2003. Ulcer persisted despite Protonix 40 mg BID,Carafate 1 gQID,Zantac, misoprostolQID, and amitriptyline for the pain.  No change withadding Prevacid Solutabs, Pepcid.  EGDin 1/2020notable for 2 large marginal ulcers and anastomotic stenosis. Biopsies were negative for H. pylori. Interestingly the larger of the 2 ulcers bled less than expected despite biopsies, suspicious for some degree of ischemia. She was referred back to Dr. Toney Rakes. 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  Has had resolution of upper GI symptoms.  Evaluation to date per available records including: -12/2001:RYGB -2012: EGD. No report available -2013: EGD. 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 Refer to Bariatric Surgery for nonhealing ulcer and possible partial obstruction at the outlet of the anastomosis -04/2018: Seen by Dr. Evorn Gong who recommend referral to tertiary care center -04/2018: Seen by Dr. Toney Rakes at [facility] to discuss possible surgical options. Plan was to obtain her previous UGI series and follow-up. Gastrin elevated at 151. -CT abdomen/pelvis 08/2018 without acute intra-abdominal pathology, compression deformity of T10  Developed diarrhea and fecal  seepage in 03/2019. 4-5 liquid stools/day (baseline 2-3 formed stools/day).  No hematochezia or melena.  No abdominal pain.  Thought possibly 2/2 new Sinemet in 03/2019.  Work-up unrevealing to include GI PCR panel, C. difficile, Giardia, CBC, BMP.    Treated with Imodium and fiber supplement.  Changed to Lomotil, stopped PPI (reflux resolved with surgery earlier this year), and probiotic trial.  Endoscopic history: Colonoscopy (2009): No report available EGD (2012): No report available EGD (2013): No report available Colonoscopy (12/2016): Diverticulosis EGD (12/2016): Anastomotic ulcer EGD (03/2018): Anastomotic ulcer, hiatal hernia EGD (10/2018, Dr. Bryan Lemma): 3 cm hiatal hernia, large marginal ulcers, measuring 10 mm and 15 mm in size with mild stenosis of the anastomosis. Biopsies negative for H. pylori. Interestingly, the larger of the 2 ulcers blood less than expected with forceps, concerning for ischemia. Erythematous gastric body, biopsies negative for H. pylori. Colonoscopy (08/2019, Dr. Bryan Lemma): Diverticulosis, otherwise normal, with biopsies negative for [facility], IBD.  Normal TI.  Referred for ARM ARM (09/2019): Results pending  HPI:     Patient is a 73 y.o. female presenting to the Gastroenterology Clinic for follow-up.  Last seen by me in 06/2019 with c/o ongoing diarrhea and episodic fecal seepage.  Was no longer having any improvement/relief with Imodium.  Changed to Lomotil, stopped PPI (she 1/2-ed the dose), encouraged to take fiber supplement (not done), and trial probiotic (not done).  Colonoscopy last month was largely unrevealing, to include biopsies negative for Microscopic Colitis, IBD, etc.  Was referred for ARM, which was completed last week, with results pending.  Today, she states she is overall improvement in diarrhea.  Stools are now soft, with decreased frequency.  Single episode of fecal seepage since her last appointment.  Has not trialed probiotic or fiber supplement  as above.  Stopped Prevacid, but with breakthrough upper GI symptoms, so she went back to half the previous dose, with good control of her upper GI.  Separate c/o "feeling like I'm in a fog". Has been ongoing for the better part of the year, but paying more attention lately. Has appt with Neurology in January.   Continues to have nausea, on a near daily basis.  Temporally related to the start of Sinemet, along with seemingly related to the daily dose of Sinemet, occurring within 1.5 hours of taking Rx.  Improved with Zofran.  Very rare episodes of emesis.  Has improved somewhat since changing Sinemet to sustained-release.    Review of systems:     No chest pain, no SOB, no fevers, no urinary sx   Past Medical History:  Diagnosis Date  . AC (acromioclavicular) joint bone spurs    heels  . Anemia   . Cataract 03/11/2019  . Elbow fracture, right   . Esophageal ring   . Gastric ulcer   . GERD (gastroesophageal reflux disease)   . Heart murmur   . Hiatal hernia    EGD 10-27-10/[facility]  . Neuromuscular disorder ([place]) 04/10/2019   parkinson's disease  . Shoulder fracture, right   . Wrist fracture, right     Patient's surgical history, family medical history, social history, medications and allergies were all reviewed in Epic    Current Outpatient Medications  Medication Sig Dispense Refill  . acetaminophen (TYLENOL) 500 MG tablet Take 500 mg by mouth as needed.    Marland Kitchen amitriptyline (ELAVIL) 25 MG tablet Take 25 mg by mouth at bedtime.    . carbidopa-levodopa (SINEMET IR) 25-100 MG tablet Take 1 tablet by mouth 2 (two) times a day.    . diphenoxylate-atropine (LOMOTIL) 2.5-0.025 MG tablet Take 2 tablets by mouth four times daily until diarrhea is gone and then every 6 hours as needed for diarrhea. 90 tablet 0  . ergocalciferol (VITAMIN D2) 1.25 MG (50000 UT) capsule Take 50,000 Units by mouth once a week.    . lansoprazole (PREVACID) 30 MG capsule Take 30 mg by mouth  2 (two) times a day.    . ondansetron (ZOFRAN) 4 MG tablet Take 1 tablet (4 mg total) by mouth every 8 (eight) hours as needed. 180 tablet 3   No current facility-administered medications for this visit.     Physical Exam:     BP 102/64   Pulse 80   Temp 98.4 F (36.9 C)   Ht 5' (1.524 m)   Wt 104 lb 6 oz (47.3 kg)   BMI 20.38 kg/m   GENERAL:  Pleasant female in NAD PSYCH: : Cooperative, normal affect EENT:  conjunctiva pink, mucous membranes moist, neck supple without masses CARDIAC:  RRR, no murmur heard, no peripheral edema PULM: Normal respiratory effort, lungs CTA bilaterally, no wheezing ABDOMEN:  Nondistended, soft, nontender. No obvious masses, no hepatomegaly,  normal bowel sounds SKIN:  turgor, no lesions seen Musculoskeletal:  Normal muscle tone, normal strength NEURO: Alert and oriented x 3, no focal neurologic deficits   IMPRESSION and PLAN:    1) Diarrhea 2) Fecal seepage Good clinical improvement since last appointment, now with soft stools, decreased frequency.  Only a single episode of seepage since last appointment. -Unable to wean completely off acid suppression therapy due to some breakthrough UGI symptoms -Resume Lomotil -Start fiber supplement, such as Benefiber -Again discussed ADR profile of Sinemet -Colonoscopy otherwise unrevealing -Will follow-up ARM results -Starting probiotic, such as Electronics engineer -  Additional recommendations (i.e. Kegel exercises, biofeedback, Solesta injection, etc.) pending ARM  3) Nausea: Still seems temporally related to Sinemet.  Has improved somewhat since changing to sustained release, but still occurs on a near daily basis. -Trial taking Zofran 30-60 minutes before Sinemet -Zofran otherwise improves nausea  4) GERD 5) History of hiatal hernia 6) History of marginal ulcer -Hernia corrected at time of crural plasty along with gastric revision earlier this year. -Some breakthrough reflux symptoms when trying to wean off  Prevacid completely.  Now at half previous dose, with good control of symptoms  7) Parkinson's 8) Brain fog -Has follow-up appointment scheduled with neurology next month  I spent a total of 25 minutes of face-to-face time with the patient and her husband. Greater than 50% of the time was spent counseling and coordinating care.            Lavena Bullion ,DO, FACG 09/16/2019, 10:39 AM

## 2019-09-16 NOTE — Patient Instructions (Addendum)
Please start taking a daily fiber supplement such as citrucel, benefiber, metamucil, or fiber choice.   Please purchase the following medications over the counter and take as directed: Align  It was a pleasure to see you today!  Return in 6-12 months    [place], D.O.

## 2019-10-17 DIAGNOSIS — G20 Parkinson's disease: Secondary | ICD-10-CM | POA: Diagnosis not present

## 2019-10-17 DIAGNOSIS — D510 Vitamin B12 deficiency anemia due to intrinsic factor deficiency: Secondary | ICD-10-CM | POA: Diagnosis not present

## 2019-10-23 DIAGNOSIS — Z1231 Encounter for screening mammogram for malignant neoplasm of breast: Secondary | ICD-10-CM | POA: Diagnosis not present

## 2019-10-23 DIAGNOSIS — E538 Deficiency of other specified B group vitamins: Secondary | ICD-10-CM | POA: Diagnosis not present

## 2019-11-01 DIAGNOSIS — Z23 Encounter for immunization: Secondary | ICD-10-CM | POA: Diagnosis not present

## 2019-11-14 DIAGNOSIS — R928 Other abnormal and inconclusive findings on diagnostic imaging of breast: Secondary | ICD-10-CM | POA: Diagnosis not present

## 2019-11-22 DIAGNOSIS — R419 Unspecified symptoms and signs involving cognitive functions and awareness: Secondary | ICD-10-CM | POA: Diagnosis not present

## 2019-11-22 DIAGNOSIS — Z20822 Contact with and (suspected) exposure to covid-19: Secondary | ICD-10-CM | POA: Diagnosis not present

## 2019-11-22 DIAGNOSIS — R29818 Other symptoms and signs involving the nervous system: Secondary | ICD-10-CM | POA: Diagnosis not present

## 2019-11-22 DIAGNOSIS — R188 Other ascites: Secondary | ICD-10-CM | POA: Diagnosis not present

## 2019-11-22 DIAGNOSIS — K7200 Acute and subacute hepatic failure without coma: Secondary | ICD-10-CM | POA: Diagnosis not present

## 2019-11-22 DIAGNOSIS — R531 Weakness: Secondary | ICD-10-CM | POA: Diagnosis not present

## 2019-11-22 DIAGNOSIS — R6521 Severe sepsis with septic shock: Secondary | ICD-10-CM | POA: Diagnosis not present

## 2019-11-22 DIAGNOSIS — Z888 Allergy status to other drugs, medicaments and biological substances status: Secondary | ICD-10-CM | POA: Diagnosis not present

## 2019-11-22 DIAGNOSIS — K760 Fatty (change of) liver, not elsewhere classified: Secondary | ICD-10-CM | POA: Diagnosis not present

## 2019-11-22 DIAGNOSIS — R404 Transient alteration of awareness: Secondary | ICD-10-CM | POA: Diagnosis not present

## 2019-11-22 DIAGNOSIS — R4020 Unspecified coma: Secondary | ICD-10-CM | POA: Diagnosis not present

## 2019-11-22 DIAGNOSIS — R918 Other nonspecific abnormal finding of lung field: Secondary | ICD-10-CM | POA: Diagnosis not present

## 2019-11-22 DIAGNOSIS — E119 Type 2 diabetes mellitus without complications: Secondary | ICD-10-CM | POA: Diagnosis present

## 2019-11-22 DIAGNOSIS — R000 Tachycardia, unspecified: Secondary | ICD-10-CM | POA: Diagnosis not present

## 2019-11-22 DIAGNOSIS — A419 Sepsis, unspecified organism: Secondary | ICD-10-CM | POA: Diagnosis not present

## 2019-11-22 DIAGNOSIS — K838 Other specified diseases of biliary tract: Secondary | ICD-10-CM | POA: Diagnosis not present

## 2019-11-22 DIAGNOSIS — E162 Hypoglycemia, unspecified: Secondary | ICD-10-CM | POA: Diagnosis not present

## 2019-11-22 DIAGNOSIS — E872 Acidosis: Secondary | ICD-10-CM | POA: Diagnosis present

## 2019-11-22 DIAGNOSIS — E161 Other hypoglycemia: Secondary | ICD-10-CM | POA: Diagnosis not present

## 2019-11-22 DIAGNOSIS — K559 Vascular disorder of intestine, unspecified: Secondary | ICD-10-CM | POA: Diagnosis not present

## 2019-11-22 DIAGNOSIS — R109 Unspecified abdominal pain: Secondary | ICD-10-CM | POA: Diagnosis not present

## 2019-11-22 DIAGNOSIS — F0390 Unspecified dementia without behavioral disturbance: Secondary | ICD-10-CM | POA: Diagnosis present

## 2019-11-22 DIAGNOSIS — Z79899 Other long term (current) drug therapy: Secondary | ICD-10-CM | POA: Diagnosis not present

## 2019-11-22 DIAGNOSIS — K8689 Other specified diseases of pancreas: Secondary | ICD-10-CM | POA: Diagnosis not present

## 2019-11-22 DIAGNOSIS — I6389 Other cerebral infarction: Secondary | ICD-10-CM | POA: Diagnosis not present

## 2019-11-22 DIAGNOSIS — G9340 Encephalopathy, unspecified: Secondary | ICD-10-CM | POA: Diagnosis present

## 2019-11-22 DIAGNOSIS — R410 Disorientation, unspecified: Secondary | ICD-10-CM | POA: Diagnosis not present

## 2019-11-22 DIAGNOSIS — K589 Irritable bowel syndrome without diarrhea: Secondary | ICD-10-CM | POA: Diagnosis present

## 2019-11-22 DIAGNOSIS — K6389 Other specified diseases of intestine: Secondary | ICD-10-CM | POA: Diagnosis not present

## 2019-11-22 DIAGNOSIS — K55059 Acute (reversible) ischemia of intestine, part and extent unspecified: Secondary | ICD-10-CM | POA: Diagnosis not present

## 2019-11-22 DIAGNOSIS — Z66 Do not resuscitate: Secondary | ICD-10-CM | POA: Diagnosis present

## 2019-12-09 DEATH — deceased

## 2019-12-13 ENCOUNTER — Other Ambulatory Visit: Payer: Self-pay | Admitting: Family Medicine

## 2019-12-13 DIAGNOSIS — K319 Disease of stomach and duodenum, unspecified: Secondary | ICD-10-CM

## 2019-12-27 ENCOUNTER — Other Ambulatory Visit: Payer: Self-pay | Admitting: Family Medicine
